# Patient Record
Sex: Male | Born: 1960 | Race: White | Hispanic: No | Marital: Married | Smoking: Never smoker
Health system: Southern US, Community
[De-identification: ages and names within clinical notes are randomized; demographics above are authoritative.]

## PROBLEM LIST (undated history)

## (undated) DIAGNOSIS — N486 Induration penis plastica: Secondary | ICD-10-CM

## (undated) DIAGNOSIS — E7800 Pure hypercholesterolemia, unspecified: Secondary | ICD-10-CM

## (undated) DIAGNOSIS — M1990 Unspecified osteoarthritis, unspecified site: Secondary | ICD-10-CM

## (undated) DIAGNOSIS — R7303 Prediabetes: Secondary | ICD-10-CM

## (undated) DIAGNOSIS — H409 Unspecified glaucoma: Secondary | ICD-10-CM

## (undated) DIAGNOSIS — H269 Unspecified cataract: Secondary | ICD-10-CM

## (undated) HISTORY — DX: Unspecified glaucoma: H40.9

## (undated) HISTORY — DX: Unspecified cataract: H26.9

## (undated) HISTORY — PX: JOINT REPLACEMENT: SHX530

## (undated) HISTORY — PX: EYE SURGERY: SHX253

## (undated) HISTORY — PX: FRACTURE SURGERY: SHX138

## (undated) HISTORY — DX: Induration penis plastica: N48.6

---

## 2001-02-10 HISTORY — PX: VASECTOMY: SHX75

## 2010-06-28 LAB — HM COLONOSCOPY

## 2013-02-11 ENCOUNTER — Encounter (HOSPITAL_COMMUNITY): Payer: Self-pay | Admitting: Pharmacy Technician

## 2013-02-11 ENCOUNTER — Encounter (HOSPITAL_COMMUNITY): Payer: Self-pay | Admitting: *Deleted

## 2013-02-11 ENCOUNTER — Other Ambulatory Visit (HOSPITAL_COMMUNITY): Payer: Self-pay | Admitting: Orthopaedic Surgery

## 2013-02-11 NOTE — Progress Notes (Signed)
I instructed patient to stop taking Naproxen, informed him that he can take Tylenol or Tylenol Arthritis.

## 2013-02-13 MED ORDER — CEFAZOLIN SODIUM-DEXTROSE 2-3 GM-% IV SOLR
2.0000 g | INTRAVENOUS | Status: DC
  Filled 2013-02-13: qty 50

## 2013-02-14 ENCOUNTER — Observation Stay (HOSPITAL_COMMUNITY)
Admission: RE | Admit: 2013-02-14 | Discharge: 2013-02-15 | Disposition: A | Discharge status: Home / Self Care | Payer: BC Managed Care – PPO | Source: Ambulatory Visit | Attending: Orthopaedic Surgery | Admitting: Orthopaedic Surgery

## 2013-02-14 ENCOUNTER — Ambulatory Visit (HOSPITAL_COMMUNITY): Payer: BC Managed Care – PPO

## 2013-02-14 ENCOUNTER — Encounter (HOSPITAL_COMMUNITY): Payer: BC Managed Care – PPO | Admitting: Anesthesiology

## 2013-02-14 ENCOUNTER — Encounter (HOSPITAL_COMMUNITY): Payer: Self-pay | Admitting: *Deleted

## 2013-02-14 ENCOUNTER — Encounter (HOSPITAL_COMMUNITY)
Admission: RE | Disposition: A | Discharge status: Home / Self Care | Payer: Self-pay | Source: Ambulatory Visit | Attending: Orthopaedic Surgery

## 2013-02-14 ENCOUNTER — Ambulatory Visit (HOSPITAL_COMMUNITY): Payer: BC Managed Care – PPO | Admitting: Anesthesiology

## 2013-02-14 DIAGNOSIS — S82892A Other fracture of left lower leg, initial encounter for closed fracture: Secondary | ICD-10-CM

## 2013-02-14 DIAGNOSIS — S8263XA Displaced fracture of lateral malleolus of unspecified fibula, initial encounter for closed fracture: Principal | ICD-10-CM | POA: Insufficient documentation

## 2013-02-14 DIAGNOSIS — Z7982 Long term (current) use of aspirin: Secondary | ICD-10-CM | POA: Insufficient documentation

## 2013-02-14 DIAGNOSIS — X58XXXA Exposure to other specified factors, initial encounter: Secondary | ICD-10-CM | POA: Insufficient documentation

## 2013-02-14 DIAGNOSIS — E7800 Pure hypercholesterolemia, unspecified: Secondary | ICD-10-CM | POA: Insufficient documentation

## 2013-02-14 HISTORY — DX: Pure hypercholesterolemia, unspecified: E78.00

## 2013-02-14 HISTORY — PX: ORIF ANKLE FRACTURE: SHX5408

## 2013-02-14 HISTORY — PX: ORIF ANKLE FRACTURE: SUR919

## 2013-02-14 LAB — CBC
HCT: 45.2 % (ref 39.0–52.0)
Hemoglobin: 14.8 g/dL (ref 13.0–17.0)
MCH: 25.6 pg — ABNORMAL LOW (ref 26.0–34.0)
MCHC: 32.7 g/dL (ref 30.0–36.0)
MCV: 78.2 fL (ref 78.0–100.0)
Platelets: 177 10*3/uL (ref 150–400)
RBC: 5.78 MIL/uL (ref 4.22–5.81)
RDW: 13.1 % (ref 11.5–15.5)
WBC: 7.4 10*3/uL (ref 4.0–10.5)

## 2013-02-14 SURGERY — OPEN REDUCTION INTERNAL FIXATION (ORIF) ANKLE FRACTURE
Anesthesia: Regional | Site: Ankle | Laterality: Left

## 2013-02-14 MED ORDER — SENNA 8.6 MG PO TABS
1.0000 | ORAL_TABLET | Freq: Two times a day (BID) | ORAL | Status: DC
Start: 2013-02-14 — End: 2013-02-15
  Administered 2013-02-14 – 2013-02-15 (×2): 8.6 mg via ORAL
  Filled 2013-02-14 (×3): qty 1

## 2013-02-14 MED ORDER — FINASTERIDE 1 MG PO TABS
1.0000 mg | ORAL_TABLET | Freq: Every morning | ORAL | Status: DC
  Filled 2013-02-14: qty 1

## 2013-02-14 MED ORDER — HYDROMORPHONE HCL PF 1 MG/ML IJ SOLN
0.2500 mg | INTRAMUSCULAR | Status: DC | PRN
Start: 2013-02-14 — End: 2013-02-14

## 2013-02-14 MED ORDER — MORPHINE SULFATE 2 MG/ML IJ SOLN: 1.0000 mg | INTRAMUSCULAR | Status: DC | PRN

## 2013-02-14 MED ORDER — LIDOCAINE HCL (CARDIAC) 20 MG/ML IV SOLN
INTRAVENOUS | Status: DC | PRN
  Administered 2013-02-14: 100 mg via INTRAVENOUS

## 2013-02-14 MED ORDER — CEFAZOLIN SODIUM-DEXTROSE 2-3 GM-% IV SOLR
2.0000 g | Freq: Four times a day (QID) | INTRAVENOUS | Status: AC
  Administered 2013-02-14 – 2013-02-15 (×3): 2 g via INTRAVENOUS
  Filled 2013-02-14 (×5): qty 50

## 2013-02-14 MED ORDER — METOCLOPRAMIDE HCL 5 MG PO TABS: 5.0000 mg | ORAL_TABLET | Freq: Three times a day (TID) | ORAL | Status: DC | PRN

## 2013-02-14 MED ORDER — FENTANYL CITRATE 0.05 MG/ML IJ SOLN
50.0000 ug | Freq: Once | INTRAMUSCULAR | Status: AC
  Administered 2013-02-14: 50 ug via INTRAVENOUS

## 2013-02-14 MED ORDER — DORZOLAMIDE HCL-TIMOLOL MAL PF 22.3-6.8 MG/ML OP SOLN: 1.0000 [drp] | Freq: Two times a day (BID) | OPHTHALMIC | Status: DC

## 2013-02-14 MED ORDER — ONDANSETRON HCL 4 MG PO TABS: 4.0000 mg | ORAL_TABLET | Freq: Four times a day (QID) | ORAL | Status: DC | PRN

## 2013-02-14 MED ORDER — PROMETHAZINE HCL 25 MG/ML IJ SOLN: 12.5000 mg | Freq: Once | INTRAMUSCULAR | Status: DC | PRN

## 2013-02-14 MED ORDER — LATANOPROST 0.005 % OP SOLN
1.0000 [drp] | Freq: Every day | OPHTHALMIC | Status: DC
  Filled 2013-02-14: qty 2.5

## 2013-02-14 MED ORDER — ONDANSETRON HCL 4 MG/2ML IJ SOLN: 4.0000 mg | Freq: Four times a day (QID) | INTRAMUSCULAR | Status: DC | PRN

## 2013-02-14 MED ORDER — ROPIVACAINE HCL 5 MG/ML IJ SOLN
INTRAMUSCULAR | Status: DC | PRN
  Administered 2013-02-14: 25 mL via PERINEURAL

## 2013-02-14 MED ORDER — MIDAZOLAM HCL 10 MG/2ML IJ SOLN
2.0000 mg | Freq: Once | INTRAMUSCULAR | Status: AC
  Administered 2013-02-14: 2 mg via INTRAVENOUS

## 2013-02-14 MED ORDER — ASPIRIN EC 325 MG PO TBEC: 325.0000 mg | DELAYED_RELEASE_TABLET | Freq: Two times a day (BID) | ORAL | Status: DC

## 2013-02-14 MED ORDER — OXYCODONE HCL 5 MG/5ML PO SOLN: 5.0000 mg | Freq: Once | ORAL | Status: DC | PRN

## 2013-02-14 MED ORDER — MIDAZOLAM HCL 2 MG/2ML IJ SOLN
INTRAMUSCULAR | Status: AC
  Administered 2013-02-14: 2 mg
  Filled 2013-02-14: qty 2

## 2013-02-14 MED ORDER — OXYCODONE HCL 5 MG PO TABS: 5.0000 mg | ORAL_TABLET | ORAL | Status: DC | PRN

## 2013-02-14 MED ORDER — LACTATED RINGERS IV SOLN
INTRAVENOUS | Status: DC | PRN
  Administered 2013-02-14: 12:00:00 via INTRAVENOUS

## 2013-02-14 MED ORDER — DORZOLAMIDE HCL-TIMOLOL MAL 2-0.5 % OP SOLN
1.0000 [drp] | Freq: Two times a day (BID) | OPHTHALMIC | Status: DC
  Filled 2013-02-14: qty 10

## 2013-02-14 MED ORDER — FENTANYL CITRATE 0.05 MG/ML IJ SOLN
INTRAMUSCULAR | Status: AC
  Administered 2013-02-14: 50 ug via INTRAVENOUS
  Filled 2013-02-14: qty 2

## 2013-02-14 MED ORDER — ONDANSETRON HCL 4 MG/2ML IJ SOLN
INTRAMUSCULAR | Status: DC | PRN
  Administered 2013-02-14: 4 mg via INTRAVENOUS

## 2013-02-14 MED ORDER — OXYCODONE HCL 5 MG PO TABS: 5.0000 mg | ORAL_TABLET | Freq: Once | ORAL | Status: DC | PRN

## 2013-02-14 MED ORDER — DIPHENHYDRAMINE HCL 12.5 MG/5ML PO ELIX: 25.0000 mg | ORAL_SOLUTION | ORAL | Status: DC | PRN

## 2013-02-14 MED ORDER — POLYETHYLENE GLYCOL 3350 17 G PO PACK: 17.0000 g | PACK | Freq: Every day | ORAL | Status: DC | PRN

## 2013-02-14 MED ORDER — CHLORHEXIDINE GLUCONATE 4 % EX LIQD: 60.0000 mL | Freq: Once | CUTANEOUS | Status: DC

## 2013-02-14 MED ORDER — METHOCARBAMOL 500 MG PO TABS
500.0000 mg | ORAL_TABLET | Freq: Four times a day (QID) | ORAL | Status: DC | PRN
  Filled 2013-02-14: qty 1

## 2013-02-14 MED ORDER — ACETAMINOPHEN 325 MG PO TABS: 325.0000 mg | ORAL_TABLET | ORAL | Status: DC | PRN

## 2013-02-14 MED ORDER — METHOCARBAMOL 100 MG/ML IJ SOLN
500.0000 mg | Freq: Four times a day (QID) | INTRAVENOUS | Status: DC | PRN
  Filled 2013-02-14: qty 5

## 2013-02-14 MED ORDER — HYDROCODONE-ACETAMINOPHEN 5-325 MG PO TABS: 1.0000 | ORAL_TABLET | Freq: Four times a day (QID) | ORAL | Status: DC | PRN

## 2013-02-14 MED ORDER — SORBITOL 70 % SOLN
30.0000 mL | Freq: Every day | Status: DC | PRN
  Filled 2013-02-14: qty 30

## 2013-02-14 MED ORDER — SODIUM CHLORIDE 0.9 % IV SOLN
INTRAVENOUS | Status: DC
  Administered 2013-02-14 – 2013-02-15 (×2): via INTRAVENOUS

## 2013-02-14 MED ORDER — HYDROCODONE-ACETAMINOPHEN 5-325 MG PO TABS
1.0000 | ORAL_TABLET | ORAL | Status: DC | PRN
  Administered 2013-02-15 (×3): 2 via ORAL
  Filled 2013-02-14 (×3): qty 2

## 2013-02-14 MED ORDER — METOCLOPRAMIDE HCL 5 MG/ML IJ SOLN: 5.0000 mg | Freq: Three times a day (TID) | INTRAMUSCULAR | Status: DC | PRN

## 2013-02-14 MED ORDER — ACETAMINOPHEN 160 MG/5ML PO SOLN
325.0000 mg | ORAL | Status: DC | PRN
  Filled 2013-02-14: qty 20.3

## 2013-02-14 MED ORDER — ASPIRIN EC 325 MG PO TBEC
325.0000 mg | DELAYED_RELEASE_TABLET | Freq: Two times a day (BID) | ORAL | Status: DC
Start: 2013-02-14 — End: 2013-02-15
  Filled 2013-02-14 (×2): qty 1

## 2013-02-14 MED ORDER — ARTIFICIAL TEARS OP OINT
TOPICAL_OINTMENT | OPHTHALMIC | Status: DC | PRN
  Administered 2013-02-14: 1 via OPHTHALMIC

## 2013-02-14 MED ORDER — CEFAZOLIN SODIUM-DEXTROSE 2-3 GM-% IV SOLR
INTRAVENOUS | Status: DC | PRN
  Administered 2013-02-14: 2 g via INTRAVENOUS

## 2013-02-14 MED ORDER — PROPOFOL 10 MG/ML IV BOLUS
INTRAVENOUS | Status: DC | PRN
  Administered 2013-02-14: 200 mg via INTRAVENOUS

## 2013-02-14 MED ORDER — MAGNESIUM CITRATE PO SOLN
1.0000 | Freq: Once | ORAL | Status: AC | PRN
  Filled 2013-02-14: qty 296

## 2013-02-14 SURGICAL SUPPLY — 61 items
BANDAGE ELASTIC 4 VELCRO ST LF (GAUZE/BANDAGES/DRESSINGS) ×2 IMPLANT
BANDAGE ELASTIC 6 VELCRO ST LF (GAUZE/BANDAGES/DRESSINGS) ×2 IMPLANT
BIT DRILL 3.5 QC 155 (BIT) ×2 IMPLANT
BIT DRILL QC 2.7 6.3IN  SHORT (BIT) ×1
BIT DRILL QC 2.7 6.3IN SHORT (BIT) ×1 IMPLANT
BNDG COHESIVE 4X5 TAN STRL (GAUZE/BANDAGES/DRESSINGS) ×2 IMPLANT
BNDG COHESIVE 6X5 TAN STRL LF (GAUZE/BANDAGES/DRESSINGS) ×2 IMPLANT
CLOTH BEACON ORANGE TIMEOUT ST (SAFETY) ×2 IMPLANT
COVER SURGICAL LIGHT HANDLE (MISCELLANEOUS) ×2 IMPLANT
CUFF TOURNIQUET SINGLE 34IN LL (TOURNIQUET CUFF) ×2 IMPLANT
CUFF TOURNIQUET SINGLE 44IN (TOURNIQUET CUFF) IMPLANT
DRAPE C-ARM 42X72 X-RAY (DRAPES) ×2 IMPLANT
DRAPE C-ARMOR (DRAPES) ×2 IMPLANT
DRAPE INCISE IOBAN 66X45 STRL (DRAPES) ×2 IMPLANT
DRAPE U-SHAPE 47X51 STRL (DRAPES) ×4 IMPLANT
DRSG PAD ABDOMINAL 8X10 ST (GAUZE/BANDAGES/DRESSINGS) ×2 IMPLANT
DURAPREP 26ML APPLICATOR (WOUND CARE) ×2 IMPLANT
ELECT CAUTERY BLADE 6.4 (BLADE) ×2 IMPLANT
ELECT REM PT RETURN 9FT ADLT (ELECTROSURGICAL) ×2
ELECTRODE REM PT RTRN 9FT ADLT (ELECTROSURGICAL) ×1 IMPLANT
FACESHIELD LNG OPTICON STERILE (SAFETY) IMPLANT
GAUZE XEROFORM 5X9 LF (GAUZE/BANDAGES/DRESSINGS) ×2 IMPLANT
GLOVE SURG SS PI 7.5 STRL IVOR (GLOVE) ×4 IMPLANT
GOWN STRL NON-REIN LRG LVL3 (GOWN DISPOSABLE) ×4 IMPLANT
GOWN STRL REIN XL XLG (GOWN DISPOSABLE) ×2 IMPLANT
KIT BASIN OR (CUSTOM PROCEDURE TRAY) ×2 IMPLANT
KIT ROOM TURNOVER OR (KITS) ×2 IMPLANT
NEEDLE HYPO 25GX1X1/2 BEV (NEEDLE) ×2 IMPLANT
NS IRRIG 1000ML POUR BTL (IV SOLUTION) ×2 IMPLANT
PACK ORTHO EXTREMITY (CUSTOM PROCEDURE TRAY) ×2 IMPLANT
PAD ARMBOARD 7.5X6 YLW CONV (MISCELLANEOUS) ×4 IMPLANT
PAD CAST 3X4 CTTN HI CHSV (CAST SUPPLIES) ×2 IMPLANT
PADDING CAST COTTON 3X4 STRL (CAST SUPPLIES) ×2
PADDING CAST COTTON 6X4 STRL (CAST SUPPLIES) ×2 IMPLANT
PADDING CAST SYN 6 (CAST SUPPLIES) ×1
PADDING CAST SYNTHETIC 4 (CAST SUPPLIES) ×1
PADDING CAST SYNTHETIC 4X4 STR (CAST SUPPLIES) ×1 IMPLANT
PADDING CAST SYNTHETIC 6X4 NS (CAST SUPPLIES) ×1 IMPLANT
PLATE TUBULAR 1/3 6 HOLE (Plate) ×2 IMPLANT
SCREW 5.0X18 (Screw) ×4 IMPLANT
SCREW CANCELLOUS 5.0X16MM (Screw) ×2 IMPLANT
SCREW NL 3.5X14 (Screw) ×2 IMPLANT
SCREW NLCK 16X3.5XST CORT PRLC (Screw) ×1 IMPLANT
SCREW NON LOCK 3.5X12 (Screw) ×2 IMPLANT
SCREW NONLOCK 3.5X16 (Screw) ×1 IMPLANT
SCREW NONLOCK 3.5X18 (Screw) ×2 IMPLANT
SPONGE GAUZE 4X4 12PLY (GAUZE/BANDAGES/DRESSINGS) ×2 IMPLANT
SPONGE GAUZE 4X4 12PLY STER LF (GAUZE/BANDAGES/DRESSINGS) ×2 IMPLANT
SPONGE LAP 18X18 X RAY DECT (DISPOSABLE) ×4 IMPLANT
SUCTION FRAZIER TIP 10 FR DISP (SUCTIONS) ×2 IMPLANT
SUT ETHILON 2 0 FS 18 (SUTURE) IMPLANT
SUT ETHILON 3 0 PS 1 (SUTURE) ×2 IMPLANT
SUT VIC AB 0 CT1 27 (SUTURE) ×1
SUT VIC AB 0 CT1 27XBRD ANBCTR (SUTURE) ×1 IMPLANT
SUT VIC AB 2-0 CT1 27 (SUTURE) ×1
SUT VIC AB 2-0 CT1 TAPERPNT 27 (SUTURE) ×1 IMPLANT
SYR CONTROL 10ML LL (SYRINGE) ×2 IMPLANT
TOWEL OR 17X24 6PK STRL BLUE (TOWEL DISPOSABLE) ×2 IMPLANT
TOWEL OR 17X26 10 PK STRL BLUE (TOWEL DISPOSABLE) ×4 IMPLANT
TUBE CONNECTING 12X1/4 (SUCTIONS) ×2 IMPLANT
WATER STERILE IRR 1000ML POUR (IV SOLUTION) ×2 IMPLANT

## 2013-02-14 NOTE — H&P (Signed)
PREOPERATIVE H&P  Chief Complaint: LEFT ANKLE FRACTURE  HPI: Devon Fowler is a 53 y.o. male who presents for surgical treatment of LEFT ANKLE FRACTURE.  He denies any changes in medical history.  Past Medical History  Diagnosis Date  . High cholesterol    Past Surgical History  Procedure Laterality Date  . Vasectomy  2003   History   Social History  . Marital Status: Married    Spouse Name: N/A    Number of Children: N/A  . Years of Education: N/A   Social History Main Topics  . Smoking status: Never Smoker   . Smokeless tobacco: None  . Alcohol Use: No  . Drug Use: No  . Sexual Activity: None   Other Topics Concern  . None   Social History Narrative  . None   History reviewed. No pertinent family history. Allergies  Allergen Reactions  . Lamisil [Terbinafine Hcl] Hives   Prior to Admission medications   Medication Sig Start Date End Date Taking? Authorizing Provider  aspirin EC 81 MG tablet Take 81 mg by mouth daily.   Yes Historical Provider, MD  Cholecalciferol (VITAMIN D) 2000 UNITS CAPS Take 2,000 Units by mouth 2 (two) times daily.   Yes Historical Provider, MD  Dorzolamide HCl-Timolol Mal PF 22.3-6.8 MG/ML SOLN Apply 1 drop to eye 2 (two) times daily.   Yes Historical Provider, MD  finasteride (PROPECIA) 1 MG tablet Take 1 mg by mouth every morning.   Yes Historical Provider, MD  latanoprost (XALATAN) 0.005 % ophthalmic solution Place 1 drop into both eyes at bedtime.   Yes Historical Provider, MD  lovastatin (MEVACOR) 40 MG tablet Take 40 mg by mouth at bedtime.   Yes Historical Provider, MD  NAPROXEN PO Take 200 mg by mouth daily as needed (for pain).   Yes Historical Provider, MD     Positive ROS: All other systems have been reviewed and were otherwise negative with the exception of those mentioned in the HPI and as above.  Physical Exam: General: Alert, no acute distress Cardiovascular: No pedal edema Respiratory: No cyanosis, no use of accessory  musculature GI: No organomegaly, abdomen is soft and non-tender Skin: No lesions in the area of chief complaint Neurologic: Sensation intact distally Psychiatric: Patient is competent for consent with normal mood and affect Lymphatic: No axillary or cervical lymphadenopathy  MUSCULOSKELETAL:  LLE: - moderate swelling of the ankle - NVI - skin intact  Assessment: LEFT ANKLE FRACTURE  Plan: Plan for Procedure(s): OPEN REDUCTION INTERNAL FIXATION (ORIF) ANKLE FRACTURE  The risks benefits and alternatives were discussed with the patient including but not limited to the risks of nonoperative treatment, versus surgical intervention including infection, bleeding, nerve injury,  blood clots, cardiopulmonary complications, morbidity, mortality, among others, and they were willing to proceed.   Cheral AlmasXu, Naiping Michael, MD   02/14/2013 12:07 PM

## 2013-02-14 NOTE — Transfer of Care (Signed)
Immediate Anesthesia Transfer of Care Note  Patient: Devon Fowler  Procedure(s) Performed: Procedure(s): OPEN REDUCTION INTERNAL FIXATION (ORIF) ANKLE FRACTURE (Left)  Patient Location: PACU  Anesthesia Type:General and Regional  Level of Consciousness: awake, alert , oriented and sedated  Airway & Oxygen Therapy: Patient Spontanous Breathing and Patient connected to nasal cannula oxygen  Post-op Assessment: Report given to PACU RN, Post -op Vital signs reviewed and stable and Patient moving all extremities  Post vital signs: Reviewed and stable  Complications: No apparent anesthesia complications

## 2013-02-14 NOTE — Preoperative (Signed)
Beta Blockers   Reason not to administer Beta Blockers:Not Applicable 

## 2013-02-14 NOTE — Anesthesia Postprocedure Evaluation (Signed)
  Anesthesia Post-op Note  Patient: Devon Fowler  Procedure(s) Performed: Procedure(s): OPEN REDUCTION INTERNAL FIXATION (ORIF) ANKLE FRACTURE (Left)  Patient Location: PACU  Anesthesia Type:General and Regional  Level of Consciousness: awake and patient cooperative  Airway and Oxygen Therapy: Patient Spontanous Breathing  Post-op Pain: none  Post-op Assessment: Post-op Vital signs reviewed, Patient's Cardiovascular Status Stable, Respiratory Function Stable, Patent Airway and No signs of Nausea or vomiting  Post-op Vital Signs: Reviewed and stable  Complications: No apparent anesthesia complications

## 2013-02-14 NOTE — Discharge Instructions (Signed)
Cast or Splint Care Casts and splints support injured limbs and keep bones from moving while they heal. It is important to care for your cast or splint at home.  HOME CARE INSTRUCTIONS  Keep the cast or splint uncovered during the drying period. It can take 24 to 48 hours to dry if it is made of plaster. A fiberglass cast will dry in less than 1 hour.  Do not rest the cast on anything harder than a pillow for the first 24 hours.  Do not put weight on your injured limb or apply pressure to the cast until your caregiver gives you permission.  Keep the cast or splint dry. Wet casts or splints can lose their shape and may not support the limb as well. Also, wet skin can become infected. Cover the cast or splint with a plastic bag when bathing or when out in the rain or snow. If the cast is on the trunk of the body, take sponge baths until the cast is removed.  Keep your cast or splint clean. Soiled casts may be wiped with a moistened cloth.  Do not place any foreign objects under your cast or splint. Do not try to scratch the skin under the cast with any object. The object could get stuck inside the cast. Also, scratching could lead to an infection.  Do not remove padding from inside your cast.  Exercise all joints next to the injury that are not immobilized by the cast or splint. For example, if you have a long leg cast, exercise the hip joint and toes. If you have an arm cast or splint, exercise the shoulder, elbow, thumb, and fingers.  Elevate your injured arm or leg on 1 or 2 pillows for the first 1 to 3 days to decrease swelling and pain.It is best if you can comfortably elevate your cast so it is higher than your heart. SEEK MEDICAL CARE IF:   Your cast or splint cracks.  Your cast or splint is too tight or too loose.  You experience unbearable itching inside the cast.  Your cast becomes wet or develops a soft spot or area.  You have a bad smell coming from inside your cast.  You  get an object stuck under your cast.  Your skin around the cast becomes red or raw.  You develop a new pain or worsening pain after the cast has been applied. SEEK IMMEDIATE MEDICAL CARE IF:   You have fluid leaking through the cast.  You are unable to move your fingers or toes.  You have discolored, cool, painful, or very swollen fingers or toes beyond the cast.  You have tingling or numbness around the injured area.  You have severe pain or pressure under the cast.  You develop any difficulty with your breathing or have shortness of breath.  You develop chest pain. Document Released: 01/25/2000 Document Revised: 04/21/2011 Document Reviewed: 08/05/2012 [facility] Patient Information 2014 [employer], [place].   1. Strict non weight bearing  2. Elevate with toes above nose 3. Take medicines as prescribed 4. May return to work next week 5. Ice extremity 20 minutes on and 20 minutes off around the clock

## 2013-02-14 NOTE — Anesthesia Procedure Notes (Signed)
Anesthesia Regional Block:  Popliteal block  Pre-Anesthetic Checklist: ,, timeout performed, Correct Patient, Correct Site, Correct Laterality, Correct Procedure, Correct Position, site marked, Risks and benefits discussed,  Surgical consent,  Pre-op evaluation,  At surgeon's request and post-op pain management  Laterality: Left  Prep: chloraprep       Needles:  Injection technique: Single-shot  Needle Type: Stimulator Needle - 80          Additional Needles:  Procedures: ultrasound guided (picture in chart) Popliteal block Narrative:  Start time: 02/14/2013 11:58 AM End time: 02/14/2013 12:06 PM Injection made incrementally with aspirations every 5 mL.  Performed by: Personally  Anesthesiologist: Maple HudsonMoser

## 2013-02-14 NOTE — Anesthesia Preprocedure Evaluation (Signed)
Anesthesia Evaluation  Patient identified by MRN, date of birth, ID band Patient awake    Reviewed: Allergy & Precautions, H&P , NPO status , Patient's Chart, lab work & pertinent test results  History of Anesthesia Complications Negative for: history of anesthetic complications  Airway Mallampati: I TM Distance: >3 FB Neck ROM: Full    Dental  (+) Teeth Intact   Pulmonary neg pulmonary ROS,    Pulmonary exam normal       Cardiovascular Exercise Tolerance: Good negative cardio ROS  Rhythm:Regular Rate:Normal - Systolic murmurs and - Diastolic murmurs    Neuro/Psych negative neurological ROS  negative psych ROS   GI/Hepatic negative GI ROS, Neg liver ROS,   Endo/Other  negative endocrine ROS  Renal/GU negative Renal ROS  negative genitourinary   Musculoskeletal   Abdominal Normal abdominal exam  (+)   Peds  Hematology negative hematology ROS (+)   Anesthesia Other Findings   Reproductive/Obstetrics                           Anesthesia Physical Anesthesia Plan  ASA: II  Anesthesia Plan: General and Regional   Post-op Pain Management:    Induction:   Airway Management Planned: LMA  Additional Equipment:   Intra-op Plan:   Post-operative Plan: Extubation in OR  Informed Consent: I have reviewed the patients History and Physical, chart, labs and discussed the procedure including the risks, benefits and alternatives for the proposed anesthesia with the patient or authorized representative who has indicated his/her understanding and acceptance.   Dental advisory given  Plan Discussed with: CRNA and Surgeon  Anesthesia Plan Comments:         Anesthesia Quick Evaluation

## 2013-02-14 NOTE — Op Note (Signed)
   Date of Surgery: 02/14/2013  INDICATIONS: [name] is a 53 y.o.-year-old male who sustained a left ankle fracture; he was indicated for open reduction and internal fixation due to the displaced nature of the articular fracture and came to the operating room today for this procedure. The patient did consent to the procedure after discussion of the risks and benefits.  PREOPERATIVE DIAGNOSIS: left lateral malleolar ankle fracture  POSTOPERATIVE DIAGNOSIS: Same.  PROCEDURE: Open treatment of left lateral malleolar ankle fracture with internal fixation  SURGEON: N. Glee ArvinMichael Ziaire Hagos, M.D.  ASSIST: none.  ANESTHESIA:  general, regional  IV FLUIDS AND URINE: See anesthesia.  ESTIMATED BLOOD LOSS: minimal mL.  IMPLANTS: [employer] 5 hole VLP semitubular plate  COMPLICATIONS: None.  DESCRIPTION OF PROCEDURE: The patient was brought to the operating room and placed supine on the operating table.  The patient had been signed prior to the procedure and this was documented. The patient had the anesthesia placed by the anesthesiologist.  A nonsterile tourniquet was placed on the upper thigh.  The prep verification and incision time-outs were performed to confirm that this was the correct patient, site, side and location. The patient had an SCD on the opposite lower extremity. The patient did receive antibiotics prior to the incision and was re-dosed during the procedure as needed at indicated intervals.  The patient had the lower extremity prepped and draped in the standard surgical fashion.  The extremity was exsanguinated using an esmarch bandage and the tourniquet was inflated to 350 mm Hg.  We began by using a laterally-based incision over the lateral aspect of the fibula. Full-thickness flaps were created. The superficial peroneal nerve was not encountered. The periosteum was sharply elevated off of the side of the fibula. Any entrapped periosteum was cleared out of the fracture site. Reduction  was achieved using manual maneuvers and a tenaculum clamp. I then placed a 3.5 mm lag screw from posterior to anterior by technique. I then released the tenaculum clamp and the fracture maintained reduction. The reduction was confirmed on orthogonal x-rays. I then sized a 5 hole VLP semitubular plate. I placed 2 5.0 mm cancellus screws distal to the fracture and 3 3.5 mm nonlocking screws proximal to the fracture. Once this was done I confirm adequate placement of the hardware on orthogonal views. I then performed a stress maneuver to assess the medial ankle ligaments. The stress maneuver did not widen the medial clear space. The wound was then thoroughly irrigated using sterile saline. The wound was then closed in a layered fashion using 0 Vicryl for the fascia 2-0 Vicryl for the subcutaneous tissue and 3-0 nylon for the skin. A sterile dressing was applied. He was placed in a short-leg splint with the ankle at 90. He woke from anesthesia uneventfully and was transferred to the PACU in stable condition.  POSTOPERATIVE PLAN: [name] will remain nonweightbearing on this leg for approximately 6 weeks; he will return for suture removal in 2 weeks.  He will be immobilized in a short leg splint and then transitioned to a short leg cast at his first follow up appointment.  [name] will receive DVT prophylaxis based on other medications, activity level, and risk ratio of bleeding to thrombosis.  Mayra ReelN. Michael Shai Rasmussen, MD [facility] [phone] 2:44 PM

## 2013-02-15 ENCOUNTER — Encounter (HOSPITAL_COMMUNITY): Payer: Self-pay | Admitting: General Practice

## 2013-02-15 NOTE — Care Management Note (Signed)
  Page 1 of 1   02/15/2013     10:17:02 AM   CARE MANAGEMENT NOTE 02/15/2013  Patient:  Devon Fowler,Devon Fowler   Account Number:  [id number]  Date Initiated:  02/15/2013  Documentation initiated by:  Ronny FlurryWILE,Julie Paolini  Subjective/Objective Assessment:     Action/Plan:   Anticipated DC Date:  02/15/2013   Anticipated DC Plan:           Choice offered to / List presented to:             Status of service:   Medicare Important Message given?   (If response is "NO", the following Medicare IM given date fields will be blank) Date Medicare IM given:   Date Additional Medicare IM given:    Discharge Disposition:    Per UR Regulation:    If discussed at Long Length of Stay Meetings, dates discussed:    Comments:  02-15-13 Patient needing knee scooter . Jill AlexandersJustin from [facility] instructed case manager to give patient a copy of order and send patient or family to retail store [address] phone [phone].   Patient stated he has crutches and his wife could take him . Patient does not want rolling walker , 3 in 1 or bedside commode .   No PT or OT notes entered at present.   Ronny FlurryHeather Khushboo Chuck RN BSN [phone]

## 2013-02-15 NOTE — Discharge Summary (Signed)
Physician Discharge Summary  Patient ID: Devon Fowler MRN: [id number] DOB/AGE: 55/20/1962 55 y.o.  Admit date: 20-Feb-2013 Discharge date: 02/15/2013  Admission Diagnoses:  Ankle fracture, left  Discharge Diagnoses:  Principal Problem:   Ankle fracture, left Active Problems:   Closed left ankle fracture   Past Medical History  Diagnosis Date  . High cholesterol     Surgeries: Procedure(s): OPEN REDUCTION INTERNAL FIXATION (ORIF) ANKLE FRACTURE on Feb 20, 2013   Consultants (if any):    Discharged Condition: Improved  Hospital Course: Devon Fowler is an 53 y.o. male who was admitted 2013/02/20 with a diagnosis of Ankle fracture, left and went to the operating room on 20-Feb-2013 and underwent the above named procedures.    He was given perioperative antibiotics:      Anti-infectives   Start     Dose/Rate Route Frequency Ordered Stop   2013/02/20 2030  ceFAZolin (ANCEF) IVPB 2 g/50 mL premix     2 g 100 mL/hr over 30 Minutes Intravenous Every 6 hours 20-Feb-2013 2029 02/15/13 1429   02-20-2013 0600  ceFAZolin (ANCEF) IVPB 2 g/50 mL premix  Status:  Discontinued     2 g 100 mL/hr over 30 Minutes Intravenous On call to O.R. 02/13/13 1609 02/20/13 2017    .  He was given sequential compression devices, early ambulation, and aspirin for DVT prophylaxis.  He benefited maximally from the hospital stay and there were no complications.    Recent vital signs:  Filed Vitals:   02/15/13 0500  BP: 117/75  Pulse: 54  Temp: 97.9 F (36.6 C)  Resp: 16    Recent laboratory studies:  Lab Results  Component Value Date   HGB 14.8 Feb 20, 2013   Lab Results  Component Value Date   WBC 7.4 20-Feb-2013   PLT 177 20-Feb-2013   No results found for this basename: INR   No results found for this basename: NA,  K,  CL,  CO2,  bun,  creatinine,  glucose    Discharge Medications:     Medication List         aspirin EC 325 MG tablet  Take 1 tablet (325 mg total) by mouth 2 (two) times daily.     Dorzolamide HCl-Timolol Mal PF 22.3-6.8 MG/ML Soln  Apply 1 drop to eye 2 (two) times daily.     finasteride 1 MG tablet  Commonly known as:  PROPECIA  Take 1 mg by mouth every morning.     HYDROcodone-acetaminophen 5-325 MG per tablet  Commonly known as:  NORCO  Take 1-2 tablets by mouth every 6 (six) hours as needed.     latanoprost 0.005 % ophthalmic solution  Commonly known as:  XALATAN  Place 1 drop into both eyes at bedtime.     lovastatin 40 MG tablet  Commonly known as:  MEVACOR  Take 40 mg by mouth at bedtime.     NAPROXEN PO  Take 200 mg by mouth daily as needed (for pain).     Vitamin D 2000 UNITS Caps  Take 2,000 Units by mouth 2 (two) times daily.        Diagnostic Studies: Dg Ankle Complete Left  02/20/13   CLINICAL DATA:  Fracture fixation.  EXAM: LEFT ANKLE COMPLETE - 3+ VIEW; DG C-ARM 1-60 MIN  COMPARISON:  None.  FINDINGS: We are provided with 3 fluoroscopic intraoperative spot views of the left ankle. Images demonstrate plate and screws and interfragmentary screw fixation of a distal fibular fracture. Position and alignment appear  near anatomic. No acute abnormality is identified.  IMPRESSION: ORIF left fibular fracture.   Electronically Signed   By: Drusilla Kannerhomas  Dalessio M.D.   On: 02/14/2013 15:39   Dg C-arm 1-60 Min  02/14/2013   CLINICAL DATA:  Fracture fixation.  EXAM: LEFT ANKLE COMPLETE - 3+ VIEW; DG C-ARM 1-60 MIN  COMPARISON:  None.  FINDINGS: We are provided with 3 fluoroscopic intraoperative spot views of the left ankle. Images demonstrate plate and screws and interfragmentary screw fixation of a distal fibular fracture. Position and alignment appear near anatomic. No acute abnormality is identified.  IMPRESSION: ORIF left fibular fracture.   Electronically Signed   By: Drusilla Kannerhomas  Dalessio M.D.   On: 02/14/2013 15:39    Disposition: Final discharge disposition not confirmed  Discharge Orders   Future Orders Complete By Expires   Call MD / Call 911  As  directed    Comments:     If you experience chest pain or shortness of breath, CALL 911 and be transported to the hospital emergency room.  If you develope a fever above 101.5 F, pus (white drainage) or increased drainage or redness at the wound, or calf pain, call your surgeon's office.   Constipation Prevention  As directed    Comments:     Drink plenty of fluids.  Prune juice may be helpful.  You may use a stool softener, such as Colace (over the counter) 100 mg twice a day.  Use MiraLax (over the counter) for constipation as needed.   Diet - low sodium heart healthy  As directed    Driving restrictions  As directed    Comments:     No driving while taking narcotic pain meds.   Increase activity slowly as tolerated  As directed    Non weight bearing  As directed    Questions:     Laterality:     Extremity:     Non weight bearing  As directed    Questions:     Laterality:  left   Extremity:  Lower      Follow-up Information   Follow up with Cheral AlmasXu, Naiping Michael, MD In 2 weeks.   Specialty:  Orthopedic Surgery   Contact information:   [address] [place] [id number] [phone]        Signed: Cheral AlmasXu, Naiping Michael 02/15/2013, 7:54 AM

## 2013-02-15 NOTE — Evaluation (Signed)
Physical Therapy Evaluation Patient Details Name: Devon Fowler MRN: [id number] DOB: February 23, 1960 Today's Date: 02/15/2013 Time: 0850-0905 PT Time Calculation (min): 15 min  PT Assessment / Plan / Recommendation History of Present Illness  ORIF L ankle  Clinical Impression  Pt is mod I with mobility with crutches, would benefit from knee walker to conserve energy and move around more easily at work.  Will benefit from outpatient PT once he is able to bear weight on L ankle    PT Assessment  All further PT needs can be met in the next venue of care    Follow Up Recommendations  Outpatient PT (when able to bear weight)    Does the patient have the potential to tolerate intense rehabilitation      Barriers to Discharge        Equipment Recommendations  Other (comment) (rolling knee walker)    Recommendations for Other Services     Frequency      Precautions / Restrictions Precautions Required Braces or Orthoses:  (L LE cast) Restrictions Weight Bearing Restrictions: Yes LLE Weight Bearing: Non weight bearing   Pertinent Vitals/Pain Pt c/o "discomfort" when L LE in dependent position, eases with elevation      Mobility  Bed Mobility Bed Mobility: Supine to Sit;Sit to Supine Supine to Sit: 6: Modified independent (Device/Increase time) Sit to Supine: 6: Modified independent (Device/Increase time) Details for Bed Mobility Assistance: increased time Transfers Transfers: Sit to Stand;Stand to Sit Sit to Stand: 6: Modified independent (Device/Increase time) Stand to Sit: 6: Modified independent (Device/Increase time) Details for Transfer Assistance: with crutches Ambulation/Gait Ambulation/Gait Assistance: 6: Modified independent (Device/Increase time) Ambulation Distance (Feet): 200 Feet Assistive device: Crutches Ambulation/Gait Assistance Details: pt had been using crutches prior to admission, would like a knee scooter for home and work Stairs: Yes Stairs Assistance: 5:  Supervision Stair Management Technique: With crutches Number of Stairs: 3    Exercises     PT Diagnosis: Difficulty walking  PT Problem List: Decreased strength;Decreased mobility PT Treatment Interventions:       PT Goals(Current goals can be found in the care plan section) Acute Rehab PT Goals PT Goal Formulation: No goals set, d/c therapy  Visit Information  Last PT Received On: 02/15/13 Assistance Needed: +1 History of Present Illness: ORIF L ankle       Prior Functioning  Home Living Family/patient expects to be discharged to:: Private residence Living Arrangements: Spouse/significant other Type of Home: House Home Access: Stairs to enter Technical brewer of Steps: 2 Home Layout: Able to live on main level with bedroom/bathroom Home Equipment: Crutches Prior Function Level of Independence: Independent Communication Communication: No difficulties    Cognition  Cognition Arousal/Alertness: Awake/alert Behavior During Therapy: WFL for tasks assessed/performed Overall Cognitive Status: Within Functional Limits for tasks assessed    Extremity/Trunk Assessment Upper Extremity Assessment Upper Extremity Assessment: Overall WFL for tasks assessed Lower Extremity Assessment Lower Extremity Assessment: Overall WFL for tasks assessed Cervical / Trunk Assessment Cervical / Trunk Assessment: Normal   Balance    End of Session PT - End of Session Equipment Utilized During Treatment: Gait belt Activity Tolerance: Patient tolerated treatment well Patient left: in bed;with call bell/phone within reach Nurse Communication: Mobility status  GP Functional Assessment Tool Used: clinical judgement Functional Limitation: Mobility: Walking and moving around Mobility: Walking and Moving Around Current Status (B4037): At least 1 percent but less than 20 percent impaired, limited or restricted Mobility: Walking and Moving Around Goal Status [phone]): At  least 1 percent but  less than 20 percent impaired, limited or restricted Mobility: Walking and Moving Around Discharge Status [phone]): At least 1 percent but less than 20 percent impaired, limited or restricted   [facility] 02/15/2013, 10:44 AM

## 2013-02-15 NOTE — Progress Notes (Signed)
   Subjective:  Patient reports pain as mild.    Objective:   VITALS:   Filed Vitals:   02/14/13 1945 02/14/13 2000 02/14/13 2019 02/15/13 0500  BP: 124/68  104/62 117/75  Pulse: 62 67 66 54  Temp:   97.5 F (36.4 C) 97.9 F (36.6 C)  TempSrc:   Oral Oral  Resp: 12 11 15 16   Height:   6\' 2"  (1.88 m)   Weight:   86.637 kg (191 lb)   SpO2: 96% 98% 99% 94%    Neurologically intact Neurovascular intact Sensation intact distally Intact pulses distally Incision: dressing C/D/I and no drainage No cellulitis present Compartment soft   Lab Results  Component Value Date   WBC 7.4 02/14/2013   HGB 14.8 02/14/2013   HCT 45.2 02/14/2013   MCV 78.2 02/14/2013   PLT 177 02/14/2013     Assessment/Plan: 1 Day Post-Op   Problem List Items Addressed This Visit     Musculoskeletal and Integument   *Ankle fracture, left - Primary   Relevant Orders      Non weight bearing      Call MD / Call 911      Diet - low sodium heart healthy      Constipation Prevention      Increase activity slowly as tolerated      Driving restrictions      Non weight bearing      Advance diet Up with therapy Up with PT/OT DVT ppx - SCDs, ambulation, asa NWB left and lower extremity Pain control Discharge planning - may d/c home when pt clears PT and has DMEs   Cheral AlmasXu, Szymon Foiles Michael 02/15/2013, 7:53 AM [phone]

## 2013-02-15 NOTE — Progress Notes (Signed)
Patient discharged home in stable condition. Verbalizes understanding of all discharge instructions, including home medications and follow up appointments. 

## 2013-02-15 NOTE — Evaluation (Signed)
Occupational Therapy Evaluation Patient Details Name: Adalberto Illed J Nilan MRN: [id number] DOB: [birth year] Today's Date: 02/15/2013 Time: [id number] OT Time Calculation (min): 28 min  OT Assessment / Plan / Recommendation History of present illness 53 yo male s/p ORIF Lt ankle currently NWB   Clinical Impression   Patient evaluated by Occupational Therapy with no further acute OT needs identified. All education has been completed and the patient has no further questions. See below for any follow-up Occupational Therapy or equipment needs. OT to sign off. Thank you for referral.      OT Assessment  Patient does not need any further OT services    Follow Up Recommendations  No OT follow up    Barriers to Discharge      Equipment Recommendations  None recommended by OT    Recommendations for Other Services    Frequency       Precautions / Restrictions Precautions Precautions: Fall Required Braces or Orthoses:  (L LE cast) Restrictions Weight Bearing Restrictions: Yes LLE Weight Bearing: Non weight bearing   Pertinent Vitals/Pain None reported    ADL  Eating/Feeding: Independent Where Assessed - Eating/Feeding: Chair Grooming: Wash/dry hands;Wash/dry face;Modified independent Where Assessed - Grooming: Other (comment) (unsupported on knee walker) Lower Body Dressing: Modified independent Where Assessed - Lower Body Dressing: Other (comment) (EOB sit<>STand) Toilet Transfer: Supervision/safety Toilet Transfer Method: Sit to [place] Toilet Transfer Equipment: Regular height toilet;Grab bars Equipment Used: Gait belt;Other (comment) (knee walker) Transfers/Ambulation Related to ADLs: Pt transfered from EOB to knee walker. Pt transfered from knee walker to chair with wheels. Pt with lOB exiting room with knee walker. Pt educated on turning and backing up with knee walker ADL Comments: Pt educated on using double bags on LT LE to prevent dressing from getting wet with showering. Pt has  grab bar in shower and plans to stand. pt educated on use of seat to allow NWB status to be maintained. Pt expressed being able to complete task in NWB. Pt and wife educated on cutting scrub pants or socks for dressing left LE. Pt advised to take time out of work schedule to elevate LT LE. 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 Pt advised that reducing caseload initially might be advised in case pain from decr elevation occurred. Pt completed toilet transfer with knee walker. Pt plans to use crutches at home and knee walker at work    OT Diagnosis:    OT Problem List:   OT Treatment Interventions:     OT Goals(Current goals can be found in the care plan section)    Visit Information  Last OT Received On: 02/15/13 Assistance Needed: +1 History of Present Illness: 53 yo male s/p ORIF Lt ankle currently NWB       Prior Functioning     Home Living Family/patient expects to be discharged to:: Private residence Living Arrangements: Spouse/significant other Available Help at Discharge: Family Type of Home: House Home Access: Stairs to enter Secretary/administratorntrance Stairs-Number of Steps: 2 Home Layout: Able to live on main level with bedroom/bathroom Home Equipment: Crutches (will have knee walker- it is ordered) Prior Function Level of Independence: Independent Communication Communication: No difficulties Dominant Hand: Right         Vision/Perception Vision - History Baseline Vision: Wears glasses all the time Patient Visual Report: No change from baseline   Cognition  Cognition Arousal/Alertness: Awake/alert Behavior During Therapy: WFL for tasks assessed/performed Overall Cognitive Status: Within Functional Limits for tasks  assessed    Extremity/Trunk Assessment Upper Extremity Assessment Upper Extremity Assessment: Overall WFL for tasks assessed Lower Extremity Assessment Lower Extremity Assessment: Defer to PT evaluation Cervical / Trunk  Assessment Cervical / Trunk Assessment: Normal     Mobility Bed Mobility Bed Mobility: Supine to Sit;Sitting - Scoot to Edge of Bed;Sit to Supine Supine to Sit: 6: Modified independent (Device/Increase time) Sitting - Scoot to Edge of Bed: 6: Modified independent (Device/Increase time) Sit to Supine: 6: Modified independent (Device/Increase time) Details for Bed Mobility Assistance: increased time Transfers Transfers: Sit to Stand;Stand to Sit Sit to Stand: 6: Modified independent (Device/Increase time) Stand to Sit: 6: Modified independent (Device/Increase time) Details for Transfer Assistance: educated on hand placement with knee walker     Exercise     Balance     End of Session OT - End of Session Activity Tolerance: Patient tolerated treatment well Patient left: in bed;with call bell/phone within reach;with family/visitor present Nurse Communication: Mobility status;Precautions  GO Functional Assessment Tool Used: clincial judgement Functional Limitation: Self care Self Care Current Status (Z3664): At least 1 percent but less than 20 percent impaired, limited or restricted Self Care Goal Status (Q0347): At least 1 percent but less than 20 percent impaired, limited or restricted Self Care Discharge Status [phone]): At least 1 percent but less than 20 percent impaired, limited or restricted   Harolyn Rutherford 02/15/2013, 11:59 AM Pager: [phone]

## 2013-02-16 ENCOUNTER — Encounter (HOSPITAL_COMMUNITY): Payer: Self-pay | Admitting: Orthopaedic Surgery

## 2013-04-08 ENCOUNTER — Ambulatory Visit: Payer: BC Managed Care – PPO | Attending: Orthopaedic Surgery

## 2013-04-08 DIAGNOSIS — R609 Edema, unspecified: Secondary | ICD-10-CM | POA: Insufficient documentation

## 2013-04-08 DIAGNOSIS — M25673 Stiffness of unspecified ankle, not elsewhere classified: Secondary | ICD-10-CM | POA: Insufficient documentation

## 2013-04-08 DIAGNOSIS — M25579 Pain in unspecified ankle and joints of unspecified foot: Secondary | ICD-10-CM | POA: Insufficient documentation

## 2013-04-08 DIAGNOSIS — M6281 Muscle weakness (generalized): Secondary | ICD-10-CM | POA: Insufficient documentation

## 2013-04-08 DIAGNOSIS — M25676 Stiffness of unspecified foot, not elsewhere classified: Secondary | ICD-10-CM | POA: Insufficient documentation

## 2013-04-08 DIAGNOSIS — IMO0001 Reserved for inherently not codable concepts without codable children: Secondary | ICD-10-CM | POA: Insufficient documentation

## 2013-04-15 ENCOUNTER — Ambulatory Visit: Payer: BC Managed Care – PPO | Admitting: Physical Therapy

## 2013-04-22 ENCOUNTER — Encounter: Payer: BC Managed Care – PPO | Admitting: Physical Therapy

## 2013-04-29 ENCOUNTER — Encounter: Payer: BC Managed Care – PPO | Admitting: Physical Therapy

## 2013-05-06 ENCOUNTER — Encounter: Payer: BC Managed Care – PPO | Admitting: Physical Therapy

## 2016-10-17 LAB — PSA: PSA: 0.4

## 2016-12-18 ENCOUNTER — Other Ambulatory Visit: Payer: Self-pay | Admitting: Internal Medicine

## 2016-12-18 DIAGNOSIS — R5381 Other malaise: Secondary | ICD-10-CM

## 2016-12-19 ENCOUNTER — Other Ambulatory Visit: Payer: Self-pay | Admitting: Internal Medicine

## 2016-12-19 DIAGNOSIS — E291 Testicular hypofunction: Secondary | ICD-10-CM

## 2017-01-16 ENCOUNTER — Ambulatory Visit
Admission: RE | Admit: 2017-01-16 | Discharge: 2017-01-16 | Disposition: A | Discharge status: Home / Self Care | Payer: BLUE CROSS/BLUE SHIELD | Source: Ambulatory Visit | Attending: Internal Medicine | Admitting: Internal Medicine

## 2017-01-16 ENCOUNTER — Other Ambulatory Visit: Payer: Self-pay

## 2017-01-16 DIAGNOSIS — E291 Testicular hypofunction: Secondary | ICD-10-CM

## 2017-04-17 DIAGNOSIS — E785 Hyperlipidemia, unspecified: Secondary | ICD-10-CM | POA: Insufficient documentation

## 2017-04-17 LAB — BASIC METABOLIC PANEL
BUN: 21 (ref 4–21)
Creatinine: 1 (ref 0.6–1.3)
Glucose: 95
Potassium: 4.3 (ref 3.4–5.3)
Sodium: 138 (ref 137–147)

## 2017-04-17 LAB — TSH: TSH: 5.02 (ref 0.41–5.90)

## 2017-04-17 LAB — HEMOGLOBIN A1C: Hemoglobin A1C: 6.1

## 2017-11-08 ENCOUNTER — Encounter: Payer: Self-pay | Admitting: Internal Medicine

## 2017-11-08 DIAGNOSIS — E785 Hyperlipidemia, unspecified: Secondary | ICD-10-CM

## 2017-11-13 ENCOUNTER — Encounter: Payer: Self-pay | Admitting: Internal Medicine

## 2017-11-13 ENCOUNTER — Ambulatory Visit (INDEPENDENT_AMBULATORY_CARE_PROVIDER_SITE_OTHER): Payer: PRIVATE HEALTH INSURANCE | Admitting: Internal Medicine

## 2017-11-13 VITALS — BP 114/78 | HR 70 | Temp 98.0°F | Ht 72.5 in | Wt 181.6 lb

## 2017-11-13 DIAGNOSIS — Z0000 Encounter for general adult medical examination without abnormal findings: Secondary | ICD-10-CM | POA: Diagnosis not present

## 2017-11-13 DIAGNOSIS — Z1212 Encounter for screening for malignant neoplasm of rectum: Secondary | ICD-10-CM

## 2017-11-13 DIAGNOSIS — Z712 Person consulting for explanation of examination or test findings: Secondary | ICD-10-CM | POA: Diagnosis not present

## 2017-11-13 LAB — POC HEMOCCULT BLD/STL (OFFICE/1-CARD/DIAGNOSTIC): Fecal Occult Blood, POC: NEGATIVE

## 2017-11-13 MED ORDER — DOXYCYCLINE MONOHYDRATE 100 MG PO TABS: 100.0000 mg | ORAL_TABLET | Freq: Two times a day (BID) | ORAL | 0 refills | Status: DC

## 2017-11-13 MED ORDER — METRONIDAZOLE 500 MG PO TABS: 500.0000 mg | ORAL_TABLET | Freq: Two times a day (BID) | ORAL | 0 refills | Status: AC

## 2017-11-13 MED ORDER — ACETAZOLAMIDE 125 MG PO TABS: 125.0000 mg | ORAL_TABLET | Freq: Two times a day (BID) | ORAL | 0 refills | Status: DC

## 2017-11-13 NOTE — Progress Notes (Signed)
Men's preventive visit. Patient Health Questionnaire (PHQ-2) is    Office Visit from 11/13/2017 in [facility]  PHQ-2 Total Score  0    . Patient is on a blood type B diet. Marital status: Married. Relevant history for alcohol use is:  Social History   Substance and Sexual Activity  Alcohol Use No  . Relevant history for tobacco use is: no.  Social History   Tobacco Use  Smoking Status Never Smoker  Smokeless Tobacco Never Used  .  Subjective:     Patient ID: Devon Fowler , male    DOB: [birth year] , 57 y.o.   MRN: [id number]   HE IS HERE TODAY FOR A FULL PHYSICAL EXAM. HE IS HEADED TO [place] 12/11/17.    Past Medical History:  Diagnosis Date  . High cholesterol       Current Outpatient Medications:  .  aspirin (ASPIR-LOW) 81 MG EC tablet, Take 81 mg by mouth daily. Swallow whole., Disp: , Rfl:  .  Cholecalciferol (VITAMIN D-3) 5000 UNIT/ML LIQD, Place under the tongue., Disp: , Rfl:  .  Coenzyme Q10 (CO Q-10) 200 MG CAPS, Take 1 capsule by mouth 2 (two) times daily., Disp: , Rfl:  .  Dorzolamide HCl-Timolol Mal PF 22.3-6.8 MG/ML SOLN, Apply 1 drop to eye 2 (two) times daily., Disp: , Rfl:  .  finasteride (PROSCAR) 5 MG tablet, Take 2.5 mg by mouth daily., Disp: , Rfl:  .  latanoprost (XALATAN) 0.005 % ophthalmic solution, Place 1 drop into both eyes at bedtime., Disp: , Rfl:  .  Menaquinone-7 (VITAMIN K2 PO), Take by mouth every morning., Disp: , Rfl:  .  milk thistle 175 MG tablet, Take 175 mg by mouth daily., Disp: , Rfl:  .  Multiple Vitamin (MULTIVITAMIN) tablet, Take 1 tablet by mouth daily., Disp: , Rfl:  .  omega-3 acid ethyl esters (LOVAZA) 1 g capsule, Take 1 g by mouth daily., Disp: , Rfl:  .  pravastatin (PRAVACHOL) 40 MG tablet, Take 40 mg by mouth daily., Disp: , Rfl:  .  Resveratrol 250 MG CAPS, Take 1 capsule by mouth at bedtime., Disp: , Rfl:  .  Turmeric 500 MG CAPS, Take by mouth., Disp: , Rfl:  .  vitamin A 40981 UNIT capsule, Take  10,000 Units by mouth every morning., Disp: , Rfl:  .  vitamin E (VITAMIN E) 400 UNIT capsule, Take 400 Units by mouth every morning., Disp: , Rfl:    Review of Systems  Constitutional: Negative.   HENT: Negative.   Eyes: Negative.   Respiratory: Negative.   Cardiovascular: Negative.   Gastrointestinal: Negative.   Endocrine: Negative.   Genitourinary: Negative.   Hematological: Negative.   Psychiatric/Behavioral: Negative.      Today's Vitals   11/13/17 1127  BP: 114/78  Pulse: 70  Temp: 98 F (36.7 C)  TempSrc: Oral  Weight: 181 lb 9.6 oz (82.4 kg)  Height: 6' 0.5" (1.842 m)   Body mass index is 24.29 kg/m.   Objective:  Physical Exam  Constitutional: He is oriented to person, place, and time. He appears well-developed and well-nourished.  HENT:  Head: Normocephalic and atraumatic.  Right Ear: External ear normal.  Left Ear: External ear normal.  Nose: Nose normal.  Mouth/Throat: Oropharynx is clear and moist.  Eyes: Pupils are equal, round, and reactive to light. Conjunctivae and EOM are normal.  Neck: Normal range of motion. Neck supple.  Cardiovascular: Normal rate, regular rhythm, normal heart sounds and  intact distal pulses.  Pulmonary/Chest: Effort normal and breath sounds normal.  Abdominal: Soft. Bowel sounds are normal. He exhibits no distension and no mass. There is no tenderness. There is no guarding.  Genitourinary: Rectal exam shows guaiac negative stool.  Musculoskeletal: Normal range of motion.  Neurological: He is alert and oriented to person, place, and time. No cranial nerve deficit.  Skin: Skin is warm and dry.  Psychiatric: He has a normal mood and affect. His behavior is normal.  Nursing note and vitals reviewed.       Assessment And Plan:     1. Routine general medical examination at a health care facility A FULL EXAM WAS PERFORMED.  DRE PERFORMED, STOOL HEME NEGATIVE.  RECENT IMMUNIZATIONS REVIEWED IN FULL DETAIL. I ALSO REVIEWED LABWORK  HE BROUGHT IN FOR REVIEW. THIS WILL BE SCANNED INTO HIS CHART.     PATIENT HAS BEEN ADVISED TO GET 30-45 MINUTES REGULAR EXERCISE NO LESS THAN FOUR TO FIVE DAYS PER WEEK - BOTH WEIGHTBEARING EXERCISES AND AEROBIC ARE RECOMMENDED.  HE WAS ADVISED TO FOLLOW A HEALTHY DIET WITH AT LEAST SIX FRUITS/VEGGIES PER DAY, DECREASE INTAKE OF RED MEAT, AND TO INCREASE FISH INTAKE TO TWO DAYS PER WEEK.  MEATS/FISH SHOULD NOT BE FRIED, BAKED OR BROILED IS PREFERABLE.  I SUGGEST WEARING SPF 50 SUNSCREEN ON EXPOSED PARTS AND ESPECIALLY WHEN IN THE DIRECT SUNLIGHT FOR AN EXTENDED PERIOD OF TIME.  PLEASE AVOID FAST FOOD RESTAURANTS AND INCREASE YOUR WATER INTAKE.   HE WAS ALSO GIVEN RX FOR MEDS TO TAKE ON HIS TRIP TO [place] - DOXYCYCLINE, ACETAZOLAMIDE AND METRONIDAZOLE - AS REQUESTED.   Gwynneth Aliment, MD

## 2017-11-16 ENCOUNTER — Encounter: Payer: Self-pay | Admitting: Internal Medicine

## 2018-02-01 ENCOUNTER — Other Ambulatory Visit: Payer: Self-pay | Admitting: Internal Medicine

## 2018-04-04 ENCOUNTER — Encounter: Payer: Self-pay | Admitting: Internal Medicine

## 2018-04-09 ENCOUNTER — Encounter: Payer: Self-pay | Admitting: Internal Medicine

## 2018-04-09 ENCOUNTER — Ambulatory Visit (INDEPENDENT_AMBULATORY_CARE_PROVIDER_SITE_OTHER): Payer: BLUE CROSS/BLUE SHIELD | Admitting: Internal Medicine

## 2018-04-09 ENCOUNTER — Encounter: Payer: No Typology Code available for payment source | Admitting: Internal Medicine

## 2018-04-09 VITALS — BP 116/68 | HR 55 | Temp 97.7°F | Ht 72.6 in | Wt 178.8 lb

## 2018-04-09 DIAGNOSIS — Z0000 Encounter for general adult medical examination without abnormal findings: Secondary | ICD-10-CM

## 2018-04-09 DIAGNOSIS — Z1212 Encounter for screening for malignant neoplasm of rectum: Secondary | ICD-10-CM

## 2018-04-09 LAB — POCT URINALYSIS DIPSTICK
Bilirubin, UA: NEGATIVE
Blood, UA: NEGATIVE
Glucose, UA: NEGATIVE
Ketones, UA: NEGATIVE
Leukocytes, UA: NEGATIVE
Nitrite, UA: NEGATIVE
Protein, UA: NEGATIVE
Spec Grav, UA: 1.01 (ref 1.010–1.025)
Urobilinogen, UA: 0.2 E.U./dL
pH, UA: 6.5 (ref 5.0–8.0)

## 2018-04-09 NOTE — Patient Instructions (Signed)

## 2018-04-11 LAB — POC HEMOCCULT BLD/STL (OFFICE/1-CARD/DIAGNOSTIC)
Card #1 Date: 3302020
Fecal Occult Blood, POC: NEGATIVE

## 2018-04-11 NOTE — Progress Notes (Addendum)
Subjective:     Patient ID: Devon Fowler , male    DOB: 03-19-1960 , 58 y.o.   MRN: [id number]   Chief Complaint  Patient presents with  . Annual Exam    HPI  He is here today for a full physical exam. He has brought in bloodwork he had performed to develop his personalized vitamins. His wife practices functional medicine. He also has a list of labs he would like to have performed today.     Past Medical History:  Diagnosis Date  . High cholesterol      Family History  Problem Relation Age of Onset  . Diabetes Mother   . Heart attack Father      Current Outpatient Medications:  .  aspirin (ASPIR-LOW) 81 MG EC tablet, Take 81 mg by mouth daily. Swallow whole., Disp: , Rfl:  .  Cholecalciferol (VITAMIN D-3) 5000 UNIT/ML LIQD, Place under the tongue., Disp: , Rfl:  .  Coenzyme Q10 (CO Q-10) 200 MG CAPS, Take 1 capsule by mouth 2 (two) times daily., Disp: , Rfl:  .  Dorzolamide HCl-Timolol Mal PF 22.3-6.8 MG/ML SOLN, Apply 1 drop to eye 2 (two) times daily., Disp: , Rfl:  .  finasteride (PROSCAR) 5 MG tablet, Take 2.5 mg by mouth daily., Disp: , Rfl:  .  latanoprost (XALATAN) 0.005 % ophthalmic solution, Place 1 drop into both eyes at bedtime., Disp: , Rfl:  .  metFORMIN (GLUCOPHAGE-XR) 500 MG 24 hr tablet, TAKE ONE TABLET BY MOUTH EACH DAY WITH EVENING MEAL, Disp: 180 tablet, Rfl: 1 .  milk thistle 175 MG tablet, Take 175 mg by mouth daily., Disp: , Rfl:  .  Multiple Vitamin (MULTIVITAMIN) tablet, Take 1 tablet by mouth daily., Disp: , Rfl:  .  omega-3 acid ethyl esters (LOVAZA) 1 g capsule, Take 1 g by mouth daily., Disp: , Rfl:  .  pravastatin (PRAVACHOL) 40 MG tablet, TAKE ONE TABLET BY MOUTH EACH DAY, Disp: 90 tablet, Rfl: 1 .  Resveratrol 250 MG CAPS, Take 1 capsule by mouth at bedtime., Disp: , Rfl:  .  Turmeric 500 MG CAPS, Take by mouth., Disp: , Rfl:  .  vitamin A 10000 UNIT capsule, Take 10,000 Units by mouth every morning., Disp: , Rfl:  .  vitamin E (VITAMIN E) 400  UNIT capsule, Take 400 Units by mouth every morning., Disp: , Rfl:  .  Menaquinone-7 (VITAMIN K2 PO), Take by mouth every morning., Disp: , Rfl:    Allergies  Allergen Reactions  . Lamisil [Terbinafine] Hives     Men's preventive visit. Patient Health Questionnaire (PHQ-2) is    Office Visit from 04/09/2018 in [facility]  PHQ-2 Total Score  0    . Patient is on a healthy diet. Marital status: Married. Relevant history for alcohol use is:  Social History   Substance and Sexual Activity  Alcohol Use No  . Relevant history for tobacco use is:  Social History   Tobacco Use  Smoking Status Never Smoker  Smokeless Tobacco Never Used  .  Review of Systems  Constitutional: Negative.   HENT: Negative.   Eyes: Negative.   Respiratory: Negative.   Cardiovascular: Negative.   Gastrointestinal: Negative.   Endocrine: Negative.   Genitourinary: Negative.   Musculoskeletal: Negative.   Skin: Negative.   Allergic/Immunologic: Negative.   Neurological: Negative.   Hematological: Negative.   Psychiatric/Behavioral: Negative.      Today's Vitals   04/09/18 1142  BP: 116/68  Pulse: Marland Kitchen)  55  Temp: 97.7 F (36.5 C)  TempSrc: Oral  Weight: 178 lb 12.8 oz (81.1 kg)  Height: 6' 0.6" (1.844 m)   Body mass index is 23.85 kg/m.   Objective:  Physical Exam Vitals signs and nursing note reviewed.  Constitutional:      Appearance: Normal appearance.  HENT:     Head: Normocephalic and atraumatic.     Right Ear: Tympanic membrane, ear canal and external ear normal.     Left Ear: Tympanic membrane, ear canal and external ear normal.     Nose: Nose normal.     Mouth/Throat:     Mouth: Mucous membranes are moist.     Pharynx: Oropharynx is clear.  Eyes:     Extraocular Movements: Extraocular movements intact.     Conjunctiva/sclera: Conjunctivae normal.     Pupils: Pupils are equal, round, and reactive to light.  Neck:     Musculoskeletal: Normal range of  motion and neck supple.  Cardiovascular:     Rate and Rhythm: Normal rate and regular rhythm.     Pulses: Normal pulses.     Heart sounds: Normal heart sounds.  Pulmonary:     Effort: Pulmonary effort is normal.     Breath sounds: Normal breath sounds.  Chest:     Breasts:        Right: Normal. No swelling, bleeding, inverted nipple, mass, nipple discharge or skin change.        Left: Normal. No swelling, bleeding, inverted nipple, mass, nipple discharge or skin change.  Abdominal:     General: Abdomen is flat. Bowel sounds are normal.     Palpations: Abdomen is soft.  Genitourinary:    Rectum: Normal. Guaiac result negative.     Comments: BPH Musculoskeletal: Normal range of motion.  Skin:    General: Skin is warm.     Capillary Refill: Capillary refill takes less than 2 seconds.  Neurological:     General: No focal deficit present.     Mental Status: He is alert.  Psychiatric:        Mood and Affect: Mood normal.         Assessment And Plan:     1. Routine general medical examination at health care facility  A full exam was performed.  DRE performed, stool is guiaic negative. Labs ordered as requested. He has also agreed to saliva testing for hormones. He is interested in optimizing his testosterone levels. I will also request his colonoscopy report to update his records. PATIENT HAS BEEN ADVISED TO GET 30-45 MINUTES REGULAR EXERCISE NO LESS THAN FOUR TO FIVE DAYS PER WEEK - BOTH WEIGHTBEARING EXERCISES AND AEROBIC ARE RECOMMENDED.  HE IS ADVISED TO FOLLOW A HEALTHY DIET WITH AT LEAST SIX FRUITS/VEGGIES PER DAY, DECREASE INTAKE OF RED MEAT, AND TO INCREASE FISH INTAKE TO TWO DAYS PER WEEK.  MEATS/FISH SHOULD NOT BE FRIED, BAKED OR BROILED IS PREFERABLE.  I SUGGEST WEARING SPF 50 SUNSCREEN ON EXPOSED PARTS AND ESPECIALLY WHEN IN THE DIRECT SUNLIGHT FOR AN EXTENDED PERIOD OF TIME.  PLEASE AVOID FAST FOOD RESTAURANTS AND INCREASE YOUR WATER INTAKE.  - CMP14+EGFR - CBC -  Hemoglobin A1c - NMR LipoProf + Graph - Apolipoprotein B - High sensitivity CRP - Homocysteine - TSH - T3, free - Testosterone, free - CK, total - Uric acid - Folate, RBC and Serum - Vitamin A - Vitamin E - Vitamin D (25 hydroxy) - Vitamin D 1,25 Dihydroxy - Magnesium, RBC - Zinc - Selenium  serum - Coenzyme Q10, Total - DHEA - PSA - Estradiol - POCT Urinalysis Dipstick (16619)    Maximino Greenland, MD

## 2018-04-12 NOTE — Addendum Note (Signed)
Addended by: Gwynneth Aliment on: 04/12/2018 06:26 PM   Modules accepted: Orders

## 2018-04-15 LAB — NMR LIPOPROF + GRAPH
Cholesterol, Total: 203 mg/dL — ABNORMAL HIGH (ref 100–199)
HDL Particle Number: 26.8 umol/L — ABNORMAL LOW (ref >=30.5)
HDL-C: 45 mg/dL (ref >39)
LDL Particle Number: 1926 nmol/L — ABNORMAL HIGH (ref <1000)
LDL Size: 20.5 nm — ABNORMAL LOW (ref >20.5)
LDL-C: 135 mg/dL — ABNORMAL HIGH (ref 0–99)
LP-IR Score: 49 — ABNORMAL HIGH (ref <=45)
Small LDL Particle Number: 1238 nmol/L — ABNORMAL HIGH (ref <=527)
Triglycerides: 116 mg/dL (ref 0–149)

## 2018-04-15 LAB — DHEA: Dehydroepiandrosterone: 124 ng/dL (ref 31–701)

## 2018-04-15 LAB — VITAMIN D 1,25 DIHYDROXY
Vitamin D 1, 25 (OH)2 Total: 37 pg/mL
Vitamin D2 1, 25 (OH)2: <10 pg/mL
Vitamin D3 1, 25 (OH)2: 36 pg/mL

## 2018-04-15 LAB — VITAMIN E
Vitamin E (Alpha Tocopherol): 31.3 mg/L — ABNORMAL HIGH (ref 7.0–25.1)
Vitamin E(Gamma Tocopherol): 0.3 mg/L — ABNORMAL LOW (ref 0.5–5.5)

## 2018-04-15 LAB — CMP14+EGFR
ALT: 23 IU/L (ref 0–44)
AST: 30 IU/L (ref 0–40)
Albumin/Globulin Ratio: 1.7 (ref 1.2–2.2)
Albumin: 5.1 g/dL — ABNORMAL HIGH (ref 3.8–4.9)
Alkaline Phosphatase: 46 IU/L (ref 39–117)
BUN/Creatinine Ratio: 13 (ref 9–20)
BUN: 15 mg/dL (ref 6–24)
Bilirubin Total: 0.8 mg/dL (ref 0.0–1.2)
CO2: 27 mmol/L (ref 20–29)
Calcium: 9.8 mg/dL (ref 8.7–10.2)
Chloride: 98 mmol/L (ref 96–106)
Creatinine, Ser: 1.15 mg/dL (ref 0.76–1.27)
GFR calc Af Amer: 80 mL/min/{1.73_m2} (ref >59)
GFR calc non Af Amer: 69 mL/min/{1.73_m2} (ref >59)
Globulin, Total: 3 g/dL (ref 1.5–4.5)
Glucose: 90 mg/dL (ref 65–99)
Potassium: 4.2 mmol/L (ref 3.5–5.2)
Sodium: 139 mmol/L (ref 134–144)
Total Protein: 8.1 g/dL (ref 6.0–8.5)

## 2018-04-15 LAB — VITAMIN D 25 HYDROXY (VIT D DEFICIENCY, FRACTURES): Vit D, 25-Hydroxy: 46.2 ng/mL (ref 30.0–100.0)

## 2018-04-15 LAB — MAGNESIUM, RBC: Magnesium RBC: 4.8 mg/dL (ref 4.2–6.8)

## 2018-04-15 LAB — FOLATE, RBC AND SERUM
Folate, Hemolysate: >620 ng/mL
Folate, RBC: >1286 ng/mL (ref >498)
Folate: >20 ng/mL (ref >3.0)

## 2018-04-15 LAB — ZINC: Zinc: 107 ug/dL (ref 56–134)

## 2018-04-15 LAB — VITAMIN A: Vitamin A: 66.7 ug/dL — ABNORMAL HIGH (ref 20.1–62.0)

## 2018-04-15 LAB — URIC ACID: Uric Acid: 6.5 mg/dL (ref 3.7–8.6)

## 2018-04-15 LAB — TSH: TSH: 4.16 u[IU]/mL (ref 0.450–4.500)

## 2018-04-15 LAB — HEMOGLOBIN A1C
Est. average glucose Bld gHb Est-mCnc: 117 mg/dL
Hgb A1c MFr Bld: 5.7 % — ABNORMAL HIGH (ref 4.8–5.6)

## 2018-04-15 LAB — TESTOSTERONE, FREE: Testosterone, Free: 10.4 pg/mL (ref 7.2–24.0)

## 2018-04-15 LAB — CBC
Hematocrit: 48.2 % (ref 37.5–51.0)
Hemoglobin: 15.3 g/dL (ref 13.0–17.7)
MCH: 24.8 pg — ABNORMAL LOW (ref 26.6–33.0)
MCHC: 31.7 g/dL (ref 31.5–35.7)
MCV: 78 fL — ABNORMAL LOW (ref 79–97)
Platelets: 232 10*3/uL (ref 150–450)
RBC: 6.18 x10E6/uL — ABNORMAL HIGH (ref 4.14–5.80)
RDW: 14.5 % (ref 11.6–15.4)
WBC: 6.1 10*3/uL (ref 3.4–10.8)

## 2018-04-15 LAB — ESTRADIOL: Estradiol: 24.2 pg/mL (ref 7.6–42.6)

## 2018-04-15 LAB — APOLIPOPROTEIN B: Apolipoprotein B: 135 mg/dL — ABNORMAL HIGH (ref <90)

## 2018-04-15 LAB — PSA: Prostate Specific Ag, Serum: 0.3 ng/mL (ref 0.0–4.0)

## 2018-04-15 LAB — HOMOCYSTEINE: Homocysteine: 11.8 umol/L (ref 0.0–14.5)

## 2018-04-15 LAB — COENZYME Q10, TOTAL: Coenzyme Q10, Total: 2.73 ug/mL — ABNORMAL HIGH (ref 0.37–2.20)

## 2018-04-15 LAB — OXIDIZED LDL: Oxidized LDL: 44 ng/mL (ref 10–170)

## 2018-04-15 LAB — HIGH SENSITIVITY CRP: CRP, High Sensitivity: 0.63 mg/L (ref 0.00–3.00)

## 2018-04-15 LAB — SELENIUM SERUM: Selenium, S/P: 198 ug/L (ref 91–198)

## 2018-04-15 LAB — CK: Total CK: 217 U/L — ABNORMAL HIGH (ref 24–204)

## 2018-04-15 LAB — T3, FREE: T3, Free: 2.6 pg/mL (ref 2.0–4.4)

## 2018-04-25 ENCOUNTER — Encounter: Payer: Self-pay | Admitting: Internal Medicine

## 2018-04-29 ENCOUNTER — Encounter: Payer: Self-pay | Admitting: Internal Medicine

## 2018-05-06 ENCOUNTER — Encounter: Payer: Self-pay | Admitting: Internal Medicine

## 2018-05-06 NOTE — Telephone Encounter (Signed)
Please schedule him for Webex next Friday at 1130 for BHRT results. Thanks

## 2018-05-09 ENCOUNTER — Encounter: Payer: Self-pay | Admitting: Internal Medicine

## 2018-05-10 ENCOUNTER — Other Ambulatory Visit: Payer: Self-pay

## 2018-05-10 MED ORDER — METFORMIN HCL ER 500 MG PO TB24: ORAL_TABLET | ORAL | 2 refills | Status: DC

## 2018-05-10 MED ORDER — PRAVASTATIN SODIUM 40 MG PO TABS: ORAL_TABLET | ORAL | 2 refills | Status: DC

## 2018-05-14 ENCOUNTER — Telehealth: Payer: Self-pay | Admitting: Internal Medicine

## 2018-05-14 ENCOUNTER — Encounter: Payer: Self-pay | Admitting: Internal Medicine

## 2018-05-14 ENCOUNTER — Other Ambulatory Visit: Payer: Self-pay

## 2018-05-14 ENCOUNTER — Ambulatory Visit (INDEPENDENT_AMBULATORY_CARE_PROVIDER_SITE_OTHER): Payer: BLUE CROSS/BLUE SHIELD | Admitting: Internal Medicine

## 2018-05-14 VITALS — Ht 72.6 in | Wt 177.0 lb

## 2018-05-14 DIAGNOSIS — N529 Male erectile dysfunction, unspecified: Secondary | ICD-10-CM

## 2018-05-14 DIAGNOSIS — E7800 Pure hypercholesterolemia, unspecified: Secondary | ICD-10-CM

## 2018-05-14 DIAGNOSIS — E8881 Metabolic syndrome: Secondary | ICD-10-CM

## 2018-05-14 DIAGNOSIS — E291 Testicular hypofunction: Secondary | ICD-10-CM

## 2018-05-14 NOTE — Telephone Encounter (Signed)
Patient has agreed to do a virtual visit with Dr. Allyne Gee for Appointment 05/14/18

## 2018-05-27 ENCOUNTER — Encounter: Payer: Self-pay | Admitting: Internal Medicine

## 2018-06-08 NOTE — Progress Notes (Signed)
Virtual Visit via Video   This visit type was conducted due to national recommendations for restrictions regarding the COVID-19 Pandemic (e.g. social distancing) in an effort to limit this patient's exposure and mitigate transmission in our community.  Due to his co-morbid illnesses, this patient is at least at moderate risk for complications without adequate follow up.  This format is felt to be most appropriate for this patient at this time.  All issues noted in this document were discussed and addressed.  A limited physical exam was performed with this format.    This visit type was conducted due to national recommendations for restrictions regarding the COVID-19 Pandemic (e.g. social distancing) in an effort to limit this patient's exposure and mitigate transmission in our community.  Patients identity confirmed using two different identifiers.  This format is felt to be most appropriate for this patient at this time.  All issues noted in this document were discussed and addressed.  No physical exam was performed (except for noted visual exam findings with Video Visits).    Date:  06/08/2018   ID:  Devon Fowler, DOB December 22, 1960, MRN [id number]  Patient Location:  Home  Provider location:   Office    Chief Complaint:  BHRT f/u -- wants saliva test results  History of Present Illness:    Devon Fowler is a 58 y.o. male who presents via video conferencing for a telehealth visit today.    The patient does not have symptoms concerning for COVID-19 infection (fever, chills, cough, or new shortness of breath).   He presents today for a virtual video visit. He has chosen this method of contact due to COVID-19 pandemic. He is anxiously awaiting the results of his recent saliva test. He believes he is in need of supplemental testosterone. He reports symptoms of ED, decreased stamina and increased joint pains. He adds that he is able to lift weights without difficulty.     Past Medical History:   Diagnosis Date  . High cholesterol    Past Surgical History:  Procedure Laterality Date  . ORIF ANKLE FRACTURE Left 02/14/2013   DR Roda Shutters  . ORIF ANKLE FRACTURE Left 02/14/2013   Procedure: OPEN REDUCTION INTERNAL FIXATION (ORIF) ANKLE FRACTURE;  Surgeon: Cheral Almas, MD;  Location: [facility] OR;  Service: Orthopedics;  Laterality: Left;  Marland Kitchen VASECTOMY  2003     Current Meds  Medication Sig  . aspirin (ASPIR-LOW) 81 MG EC tablet Take 81 mg by mouth daily. Swallow whole.  . Cholecalciferol (VITAMIN D-3) 5000 UNIT/ML LIQD Place under the tongue.  . Coenzyme Q10 (CO Q-10) 200 MG CAPS Take 1 capsule by mouth 2 (two) times daily.  . Dorzolamide HCl-Timolol Mal PF 22.3-6.8 MG/ML SOLN Apply 1 drop to eye 2 (two) times daily.  . finasteride (PROSCAR) 5 MG tablet Take 2.5 mg by mouth daily.  Marland Kitchen latanoprost (XALATAN) 0.005 % ophthalmic solution Place 1 drop into both eyes at bedtime.  . Menaquinone-7 (VITAMIN K2 PO) Take by mouth every morning.  . metFORMIN (GLUCOPHAGE-XR) 500 MG 24 hr tablet TAKE ONE TABLET BY MOUTH 2 TIMES PER DAY  . milk thistle 175 MG tablet Take 175 mg by mouth daily.  . Multiple Vitamin (MULTIVITAMIN) tablet Take 1 tablet by mouth daily.  Marland Kitchen omega-3 acid ethyl esters (LOVAZA) 1 g capsule Take 1 g by mouth daily.  Marland Kitchen Resveratrol 250 MG CAPS Take 1 capsule by mouth at bedtime.  . rosuvastatin (CRESTOR) 40 MG tablet Take 40 mg by  mouth daily.  . Turmeric 500 MG CAPS Take by mouth.  . vitamin A 93790 UNIT capsule Take 10,000 Units by mouth every morning.  . vitamin E (VITAMIN E) 400 UNIT capsule Take 400 Units by mouth every morning.     Allergies:   Lamisil [terbinafine]   Social History   Tobacco Use  . Smoking status: Never Smoker  . Smokeless tobacco: Never Used  Substance Use Topics  . Alcohol use: No  . Drug use: No     Family Hx: The patient's family history includes Diabetes in his mother; Heart attack in his father.  ROS:   Please see the history of present  illness.    Review of Systems  Constitutional: Negative.   Respiratory: Negative.   Cardiovascular: Negative.   Gastrointestinal: Negative.   Genitourinary:       Pos ED--difficulty achieving and maintaining an erection  Neurological: Negative.   Psychiatric/Behavioral: Negative.     All other systems reviewed and are negative.   Labs/Other Tests and Data Reviewed:    Recent Labs: 04/09/2018: ALT 23; BUN 15; Creatinine, Ser 1.15; Hemoglobin 15.3; Platelets 232; Potassium 4.2; Sodium 139; TSH 4.160   Recent Lipid Panel No results found for: CHOL, TRIG, HDL, CHOLHDL, LDLCALC, LDLDIRECT  Wt Readings from Last 3 Encounters:  05/14/18 177 lb (80.3 kg)  04/09/18 178 lb 12.8 oz (81.1 kg)  11/13/17 181 lb 9.6 oz (82.4 kg)     Exam:    Vital Signs:  Ht 6' 0.6" (1.844 m)   Wt 177 lb (80.3 kg) Comment: pt provided  BMI 23.61 kg/m     Physical Exam  Constitutional: He is oriented to person, place, and time and well-developed, well-nourished, and in no distress.  HENT:  Head: Normocephalic and atraumatic.  Neck: Normal range of motion.  Pulmonary/Chest: Effort normal.  Neurological: He is alert and oriented to person, place, and time.  Psychiatric: Affect normal.  Nursing note and vitals reviewed.   ASSESSMENT & PLAN:     1. Erectile dysfunction, unspecified erectile dysfunction type  I went over his saliva test results in full detail. They were significant for nl estradiol levels, low normal testosterone levels and low DHEA levels. He plans to continue with oral DHEA supplementation 5mg  twice daily.    2. Hypogonadism male Please see #2.   3. Insulin resistance  After his most recent labwork, he has since added berberine 500mg  twice daily to his supplement regimen. He is no longer on metformin.  He is following his blood sugars with Freestyle Libre.     4. Pure hypercholesterolemia  He has been evaluated by preventive cardiology at [facility]. He is no longer on  pravastatin and has been switched to rosuvastatin daily. He has not yet experienced any side effects from the rosuvastatin.   COVID-19 Education: The signs and symptoms of COVID-19 were discussed with the patient and how to seek care for testing (follow up with PCP or arrange E-visit).  The importance of social distancing was discussed today.  Patient Risk:   After full review of this patients clinical status, I feel that they are at least moderate risk at this time.  Time:   Today, I have spent 11 minutes/ 27 seconds with the patient with telehealth technology discussing above diagnoses.     Medication Adjustments/Labs and Tests Ordered: Current medicines are reviewed at length with the patient today.  Concerns regarding medicines are outlined above.   Tests Ordered: No orders of the defined types  were placed in this encounter.   Medication Changes: No orders of the defined types were placed in this encounter.   Disposition:  Follow up in 6 week(s)  Signed, Gwynneth Alimentobyn N Shenica Holzheimer, MD

## 2018-06-17 ENCOUNTER — Encounter: Payer: Self-pay | Admitting: Internal Medicine

## 2018-09-12 ENCOUNTER — Other Ambulatory Visit: Payer: Self-pay | Admitting: Internal Medicine

## 2018-09-13 ENCOUNTER — Telehealth: Payer: Self-pay

## 2018-09-13 NOTE — Telephone Encounter (Signed)
Left vm for pt to call back to schedule appt

## 2018-09-16 ENCOUNTER — Ambulatory Visit: Payer: Self-pay | Admitting: Internal Medicine

## 2018-09-16 ENCOUNTER — Encounter: Payer: Self-pay | Admitting: Internal Medicine

## 2018-09-16 ENCOUNTER — Other Ambulatory Visit: Payer: Self-pay

## 2018-09-16 VITALS — BP 108/72 | HR 40 | Temp 97.5°F | Ht 72.6 in | Wt 174.4 lb

## 2018-09-16 DIAGNOSIS — E559 Vitamin D deficiency, unspecified: Secondary | ICD-10-CM

## 2018-09-16 DIAGNOSIS — E7800 Pure hypercholesterolemia, unspecified: Secondary | ICD-10-CM

## 2018-09-16 DIAGNOSIS — R946 Abnormal results of thyroid function studies: Secondary | ICD-10-CM

## 2018-09-16 DIAGNOSIS — Z79899 Other long term (current) drug therapy: Secondary | ICD-10-CM

## 2018-09-16 DIAGNOSIS — N489 Disorder of penis, unspecified: Secondary | ICD-10-CM

## 2018-09-16 DIAGNOSIS — E291 Testicular hypofunction: Secondary | ICD-10-CM

## 2018-09-16 NOTE — Patient Instructions (Signed)

## 2018-09-19 ENCOUNTER — Encounter: Payer: Self-pay | Admitting: Internal Medicine

## 2018-09-20 LAB — INSULIN, RANDOM: INSULIN: 2.7 u[IU]/mL (ref 2.6–24.9)

## 2018-09-20 LAB — DHEA-SULFATE: DHEA-SO4: 69.2 ug/dL (ref 48.9–344.2)

## 2018-09-20 LAB — TESTOSTERONE,FREE AND TOTAL
Testosterone, Free: 11.6 pg/mL (ref 7.2–24.0)
Testosterone: 886 ng/dL (ref 264–916)

## 2018-09-20 LAB — HOMOCYSTEINE: Homocysteine: 10.6 umol/L (ref 0.0–14.5)

## 2018-09-20 LAB — THYROID PANEL WITH TSH
Free Thyroxine Index: 2.3 (ref 1.2–4.9)
T3 Uptake Ratio: 30 % (ref 24–39)
T4, Total: 7.5 ug/dL (ref 4.5–12.0)
TSH: 3.7 u[IU]/mL (ref 0.450–4.500)

## 2018-09-20 LAB — HEMOGLOBIN A1C
Est. average glucose Bld gHb Est-mCnc: 123 mg/dL
Hgb A1c MFr Bld: 5.9 % — ABNORMAL HIGH (ref 4.8–5.6)

## 2018-09-20 LAB — HIGH SENSITIVITY CRP: CRP, High Sensitivity: 0.72 mg/L (ref 0.00–3.00)

## 2018-09-20 LAB — VITAMIN D 25 HYDROXY (VIT D DEFICIENCY, FRACTURES): Vit D, 25-Hydroxy: 52.3 ng/mL (ref 30.0–100.0)

## 2018-09-20 LAB — VITAMIN E
Vitamin E (Alpha Tocopherol): 14.7 mg/L (ref 7.0–25.1)
Vitamin E(Gamma Tocopherol): 0.3 mg/L — ABNORMAL LOW (ref 0.5–5.5)

## 2018-09-20 LAB — OXIDIZED LDL: Oxidized LDL: 34 ng/mL (ref 10–170)

## 2018-09-20 LAB — VITAMIN A: Vitamin A: 62.6 ug/dL — ABNORMAL HIGH (ref 20.1–62.0)

## 2018-09-21 ENCOUNTER — Encounter: Payer: Self-pay | Admitting: Internal Medicine

## 2018-09-21 NOTE — Progress Notes (Signed)
Subjective:     Patient ID: Devon Fowler , male    DOB: 01/12/1961 , 58 y.o.   MRN: [id number]   Chief Complaint  Patient presents with  . Hyperlipidemia    HPI  He is here today for a lipid check. His wife is a functional medicine nutritionist and would like for him to have a [facility] drawn today. He would also like f/u on more of his lab parameters.     Past Medical History:  Diagnosis Date  . High cholesterol      Family History  Problem Relation Age of Onset  . Diabetes Mother   . Heart attack Father      Current Outpatient Medications:  .  aspirin (ASPIR-LOW) 81 MG EC tablet, Take 81 mg by mouth daily. Swallow whole., Disp: , Rfl:  .  Barberry-Oreg Grape-Goldenseal (BERBERINE COMPLEX PO), Take 500 mg by mouth. 1 2 times per day, Disp: , Rfl:  .  Cholecalciferol (VITAMIN D-3) 5000 UNIT/ML LIQD, Place under the tongue., Disp: , Rfl:  .  Continuous Blood Gluc Sensor (FREESTYLE LIBRE 14 DAY SENSOR) MISC, USE TO CHECK BLOOD SUGAR FOUR TIMES A DAY, Disp: 3 each, Rfl: 3 .  Dorzolamide HCl-Timolol Mal PF 22.3-6.8 MG/ML SOLN, Apply 1 drop to eye 2 (two) times daily., Disp: , Rfl:  .  finasteride (PROSCAR) 5 MG tablet, Take 2.5 mg by mouth daily., Disp: , Rfl:  .  latanoprost (XALATAN) 0.005 % ophthalmic solution, Place 1 drop into both eyes at bedtime., Disp: , Rfl:  .  Menaquinone-7 (VITAMIN K2 PO), Take by mouth every morning., Disp: , Rfl:  .  milk thistle 175 MG tablet, Take 175 mg by mouth daily., Disp: , Rfl:  .  Multiple Vitamin (MULTIVITAMIN) tablet, Take 1 tablet by mouth daily., Disp: , Rfl:  .  omega-3 acid ethyl esters (LOVAZA) 1 g capsule, Take 1 g by mouth daily., Disp: , Rfl:  .  Resveratrol 250 MG CAPS, Take 1 capsule by mouth at bedtime., Disp: , Rfl:  .  rosuvastatin (CRESTOR) 40 MG tablet, Take 40 mg by mouth daily., Disp: , Rfl:  .  TESTOSTERONE NA, Apply topically. compound, Disp: , Rfl:  .  vitamin E (VITAMIN E) 400 UNIT capsule, Take 400 Units by  mouth every morning., Disp: , Rfl:  .  Coenzyme Q10 (CO Q-10) 200 MG CAPS, Take 1 capsule by mouth 2 (two) times daily., Disp: , Rfl:  .  metFORMIN (GLUCOPHAGE-XR) 500 MG 24 hr tablet, TAKE ONE TABLET BY MOUTH 2 TIMES PER DAY (Patient not taking: Reported on 09/16/2018), Disp: 180 tablet, Rfl: 2   Allergies  Allergen Reactions  . Lamisil [Terbinafine] Hives     Review of Systems  Constitutional: Negative.   Respiratory: Negative.   Cardiovascular: Negative.   Gastrointestinal: Negative.   Genitourinary:       He reports having an area of concern near the shaft of his penis. He is not sure what this is. Would like Urology referral. Denies penile discharge/pain.   Neurological: Negative.   Psychiatric/Behavioral: Negative.      Today's Vitals   09/16/18 1131  BP: 108/72  Pulse: (!) 40  Temp: (!) 97.5 F (36.4 C)  TempSrc: Oral  Weight: 174 lb 6.4 oz (79.1 kg)  Height: 6' 0.6" (1.844 m)   Body mass index is 23.26 kg/m.   Objective:  Physical Exam Vitals signs and nursing note reviewed.  Constitutional:      Appearance: Normal appearance.  Cardiovascular:     Rate and Rhythm: Normal rate and regular rhythm.     Heart sounds: Normal heart sounds.  Pulmonary:     Effort: Pulmonary effort is normal.     Breath sounds: Normal breath sounds.  Genitourinary:    Comments: Deferred as per patient Skin:    General: Skin is warm.  Neurological:     General: No focal deficit present.     Mental Status: He is alert.  Psychiatric:        Mood and Affect: Mood normal.         Assessment And Plan:     1. Pure hypercholesterolemia  Pt advised that I do not have [facility] kits available. I will contact the company and request to have some sent here. I will check other labs as listed below. He was commended on his healthy lifestyle and encouraged to keep up the great work.   - Homocysteine - Hemoglobin A1c - High sensitivity CRP - Insulin, random(561) - OXIDIZED  LDL  2. Hypogonadism male  He is currently taking compounded testosterone daily. I will adjust his dose as needed.   - DHEA-sulfate - Testosterone,Free and Total  3. Vitamin D deficiency  I WILL CHECK A VIT D LEVEL AND SUPPLEMENT AS NEEDED.  ALSO ENCOURAGED TO SPEND 15 MINUTES IN THE SUN DAILY.  - Vitamin D (25 hydroxy)  4. Abnormal thyroid function test  I will check thyroid panel and initiate meds as needed.  - Thyroid Panel With TSH  5. Penile abnormality  I will refer him to Urology as requested.   - Ambulatory referral to Urology  6. Drug therapy  - Vitamin E - Vitamin A   Gwynneth Alimentobyn N Redith Drach, MD    THE PATIENT IS ENCOURAGED TO PRACTICE SOCIAL DISTANCING DUE TO THE COVID-19 PANDEMIC.

## 2018-09-23 LAB — T3, FREE: T3, Free: 2.6 pg/mL (ref 2.0–4.4)

## 2018-09-23 LAB — SPECIMEN STATUS REPORT

## 2018-09-23 LAB — PSA: Prostate Specific Ag, Serum: 0.3 ng/mL (ref 0.0–4.0)

## 2018-10-04 ENCOUNTER — Encounter: Payer: Self-pay | Admitting: Internal Medicine

## 2018-10-11 ENCOUNTER — Encounter: Payer: Self-pay | Admitting: Internal Medicine

## 2018-10-14 ENCOUNTER — Other Ambulatory Visit: Payer: BLUE CROSS/BLUE SHIELD

## 2018-10-14 ENCOUNTER — Other Ambulatory Visit: Payer: Self-pay

## 2018-10-22 LAB — HM DIABETES EYE EXAM

## 2018-11-21 ENCOUNTER — Encounter: Payer: Self-pay | Admitting: Internal Medicine

## 2018-11-23 ENCOUNTER — Encounter: Payer: Self-pay | Admitting: Internal Medicine

## 2018-12-02 ENCOUNTER — Telehealth: Payer: Self-pay

## 2018-12-02 NOTE — Telephone Encounter (Signed)
Left vm for pt to return call. We dont have the results of the [facility]. When spoke with them they were unable to run all test due to inadequate blood collection. They ran some of the test and we are awaiting those results and we will let him know when we get those.

## 2018-12-02 NOTE — Telephone Encounter (Signed)
The pt would like to know if he should return to the office for additional blood work because [employer] was unable to run some of the tests.  I told the pt that I would ask Dr. Baird Cancer when she returns to the office.  The pt said that there is no rush.

## 2018-12-14 ENCOUNTER — Telehealth: Payer: Self-pay

## 2018-12-14 NOTE — Telephone Encounter (Signed)
Called [facility] need to know what was wrong with the lab draw. And was his insurance billed.

## 2018-12-15 ENCOUNTER — Encounter: Payer: Self-pay | Admitting: Internal Medicine

## 2018-12-16 ENCOUNTER — Other Ambulatory Visit: Payer: Self-pay

## 2019-01-19 ENCOUNTER — Encounter: Payer: Self-pay | Admitting: Internal Medicine

## 2019-01-27 ENCOUNTER — Telehealth: Payer: Self-pay

## 2019-01-27 NOTE — Telephone Encounter (Signed)
I left the pt a message that I would need him to call me back so that I can find out if he can come another day besides next Thursday because the office will be closed.

## 2019-02-06 ENCOUNTER — Encounter: Payer: Self-pay | Admitting: Internal Medicine

## 2019-02-07 ENCOUNTER — Encounter: Payer: Self-pay | Admitting: Internal Medicine

## 2019-02-07 ENCOUNTER — Telehealth: Payer: Self-pay

## 2019-02-07 ENCOUNTER — Other Ambulatory Visit: Payer: Self-pay | Admitting: Internal Medicine

## 2019-02-07 DIAGNOSIS — E559 Vitamin D deficiency, unspecified: Secondary | ICD-10-CM

## 2019-02-07 DIAGNOSIS — Z79899 Other long term (current) drug therapy: Secondary | ICD-10-CM

## 2019-02-07 DIAGNOSIS — R946 Abnormal results of thyroid function studies: Secondary | ICD-10-CM

## 2019-02-07 DIAGNOSIS — E291 Testicular hypofunction: Secondary | ICD-10-CM

## 2019-02-07 NOTE — Telephone Encounter (Signed)
The patient was notified that the office contacted [facility] lab and was told that they don't recommend a lab draw for this Thursday because they want to make sure that the kits arrive in time for processing because they are closed on this Friday and the pt said that he will come on the 7th of January.

## 2019-02-07 NOTE — Telephone Encounter (Signed)
I left the pt a message that Dr. Baird Cancer said that the extra labs the pt requested to have done can be done through [facility] and does the pt want to have those drawn this Thursday.

## 2019-02-14 ENCOUNTER — Encounter: Payer: Self-pay | Admitting: Internal Medicine

## 2019-02-15 ENCOUNTER — Other Ambulatory Visit: Payer: Self-pay

## 2019-02-15 DIAGNOSIS — E8881 Metabolic syndrome: Secondary | ICD-10-CM

## 2019-02-17 ENCOUNTER — Other Ambulatory Visit: Payer: Self-pay | Admitting: Internal Medicine

## 2019-02-18 LAB — THYROID PANEL WITH TSH
Free Thyroxine Index: 2.1 (ref 1.2–4.9)
T3 Uptake Ratio: 28 % (ref 24–39)
T4, Total: 7.5 ug/dL (ref 4.5–12.0)
TSH: 3.29 u[IU]/mL (ref 0.450–4.500)

## 2019-02-18 LAB — CK: Total CK: 139 U/L (ref 41–331)

## 2019-02-18 LAB — HEMOGLOBIN A1C
Est. average glucose Bld gHb Est-mCnc: 117 mg/dL
Hgb A1c MFr Bld: 5.7 % — ABNORMAL HIGH (ref 4.8–5.6)

## 2019-02-18 LAB — VITAMIN D 25 HYDROXY (VIT D DEFICIENCY, FRACTURES): Vit D, 25-Hydroxy: 69 ng/mL (ref 30.0–100.0)

## 2019-02-18 LAB — URIC ACID: Uric Acid: 5.7 mg/dL (ref 3.8–8.4)

## 2019-02-24 ENCOUNTER — Encounter: Payer: Self-pay | Admitting: Internal Medicine

## 2019-02-25 LAB — TESTOSTERONE,FREE AND TOTAL
Testosterone, Free: 29.3 pg/mL — ABNORMAL HIGH (ref 6.6–18.1)
Testosterone: >1500 ng/dL — ABNORMAL HIGH (ref 264–916)

## 2019-02-25 LAB — SPECIMEN STATUS REPORT

## 2019-03-01 ENCOUNTER — Telehealth: Payer: Self-pay

## 2019-03-01 NOTE — Telephone Encounter (Signed)
The pt was given his most recent lab results.  The patient said that he was taking 26mL every day but that he stopped taking it a week ago when he saw his lab results and that he was only using the testosterone cream.  The pt said that he has been using a supplement called Ayur Guglipid 25mg  every night for his thyroid.

## 2019-03-10 ENCOUNTER — Encounter: Payer: Self-pay | Admitting: Internal Medicine

## 2019-03-23 ENCOUNTER — Encounter: Payer: Self-pay | Admitting: Internal Medicine

## 2019-03-27 ENCOUNTER — Encounter: Payer: Self-pay | Admitting: Internal Medicine

## 2019-04-14 ENCOUNTER — Other Ambulatory Visit: Payer: Self-pay

## 2019-04-14 ENCOUNTER — Ambulatory Visit (INDEPENDENT_AMBULATORY_CARE_PROVIDER_SITE_OTHER): Payer: BLUE CROSS/BLUE SHIELD | Admitting: Internal Medicine

## 2019-04-14 ENCOUNTER — Encounter: Payer: Self-pay | Admitting: Internal Medicine

## 2019-04-14 VITALS — BP 108/62 | HR 78 | Temp 97.9°F | Ht 71.6 in | Wt 174.6 lb

## 2019-04-14 DIAGNOSIS — Z0000 Encounter for general adult medical examination without abnormal findings: Secondary | ICD-10-CM

## 2019-04-14 DIAGNOSIS — Z1211 Encounter for screening for malignant neoplasm of colon: Secondary | ICD-10-CM | POA: Diagnosis not present

## 2019-04-14 DIAGNOSIS — Z8679 Personal history of other diseases of the circulatory system: Secondary | ICD-10-CM | POA: Diagnosis not present

## 2019-04-14 DIAGNOSIS — E7439 Other disorders of intestinal carbohydrate absorption: Secondary | ICD-10-CM | POA: Diagnosis not present

## 2019-04-14 LAB — POCT UA - MICROALBUMIN
Albumin/Creatinine Ratio, Urine, POC: <30
Creatinine, POC: 50 mg/dL
Microalbumin Ur, POC: 10 mg/L

## 2019-04-14 LAB — POCT URINALYSIS DIPSTICK
Bilirubin, UA: NEGATIVE
Blood, UA: NEGATIVE
Glucose, UA: NEGATIVE
Ketones, UA: NEGATIVE
Leukocytes, UA: NEGATIVE
Nitrite, UA: NEGATIVE
Protein, UA: NEGATIVE
Spec Grav, UA: 1.015 (ref 1.010–1.025)
Urobilinogen, UA: 0.2 E.U./dL
pH, UA: 5.5 (ref 5.0–8.0)

## 2019-04-14 MED ORDER — EZETIMIBE 10 MG PO TABS: 10.0000 mg | ORAL_TABLET | Freq: Every day | ORAL | 1 refills | Status: DC

## 2019-04-14 NOTE — Patient Instructions (Signed)
Nexlitol or Nexlizet  Health Maintenance, Male Adopting a healthy lifestyle and getting preventive care are important in promoting health and wellness. Ask your health care provider about:  The right schedule for you to have regular tests and exams.  Things you can do on your own to prevent diseases and keep yourself healthy. What should I know about diet, weight, and exercise? Eat a healthy diet   Eat a diet that includes plenty of vegetables, fruits, low-fat dairy products, and lean protein.  Do not eat a lot of foods that are high in solid fats, added sugars, or sodium. Maintain a healthy weight Body mass index (BMI) is a measurement that can be used to identify possible weight problems. It estimates body fat based on height and weight. Your health care provider can help determine your BMI and help you achieve or maintain a healthy weight. Get regular exercise Get regular exercise. This is one of the most important things you can do for your health. Most adults should:  Exercise for at least 150 minutes each week. The exercise should increase your heart rate and make you sweat (moderate-intensity exercise).  Do strengthening exercises at least twice a week. This is in addition to the moderate-intensity exercise.  Spend less time sitting. Even light physical activity can be beneficial. Watch cholesterol and blood lipids Have your blood tested for lipids and cholesterol at 61 years of age, then have this test every 5 years. You may need to have your cholesterol levels checked more often if:  Your lipid or cholesterol levels are high.  You are older than 61 years of age.  You are at high risk for heart disease. What should I know about cancer screening? Many types of cancers can be detected early and may often be prevented. Depending on your health history and family history, you may need to have cancer screening at various ages. This may include screening for:  Colorectal  cancer.  Prostate cancer.  Skin cancer.  Lung cancer. What should I know about heart disease, diabetes, and high blood pressure? Blood pressure and heart disease  High blood pressure causes heart disease and increases the risk of stroke. This is more likely to develop in people who have high blood pressure readings, are of African descent, or are overweight.  Talk with your health care provider about your target blood pressure readings.  Have your blood pressure checked: ? Every 3-5 years if you are 69-2 years of age. ? Every year if you are 45 years old or older.  If you are between the ages of 27 and 39 and are a current or former smoker, ask your health care provider if you should have a one-time screening for abdominal aortic aneurysm (AAA). Diabetes Have regular diabetes screenings. This checks your fasting blood sugar level. Have the screening done:  Once every three years after age 73 if you are at a normal weight and have a low risk for diabetes.  More often and at a younger age if you are overweight or have a high risk for diabetes. What should I know about preventing infection? Hepatitis B If you have a higher risk for hepatitis B, you should be screened for this virus. Talk with your health care provider to find out if you are at risk for hepatitis B infection. Hepatitis C Blood testing is recommended for:  Everyone born from [birth year] through 1967.  Anyone with known risk factors for hepatitis C. Sexually transmitted infections (STIs)  You should  be screened each year for STIs, including gonorrhea and chlamydia, if: ? You are sexually active and are younger than 61 years of age. ? You are older than 61 years of age and your health care provider tells you that you are at risk for this type of infection. ? Your sexual activity has changed since you were last screened, and you are at increased risk for chlamydia or gonorrhea. Ask your health care provider if you are at  risk.  Ask your health care provider about whether you are at high risk for HIV. Your health care provider may recommend a prescription medicine to help prevent HIV infection. If you choose to take medicine to prevent HIV, you should first get tested for HIV. You should then be tested every 3 months for as long as you are taking the medicine. Follow these instructions at home: Lifestyle  Do not use any products that contain nicotine or tobacco, such as cigarettes, e-cigarettes, and chewing tobacco. If you need help quitting, ask your health care provider.  Do not use street drugs.  Do not share needles.  Ask your health care provider for help if you need support or information about quitting drugs. Alcohol use  Do not drink alcohol if your health care provider tells you not to drink.  If you drink alcohol: ? Limit how much you have to 0-2 drinks a day. ? Be aware of how much alcohol is in your drink. In the [place] one drink equals one 12 oz bottle of beer (355 mL), one 5 oz glass of wine (148 mL), or one 1 oz glass of hard liquor (44 mL). General instructions  Schedule regular health, dental, and eye exams.  Stay current with your vaccines.  Tell your health care provider if: ? You often feel depressed. ? You have ever been abused or do not feel safe at home. Summary  Adopting a healthy lifestyle and getting preventive care are important in promoting health and wellness.  Follow your health care provider's instructions about healthy diet, exercising, and getting tested or screened for diseases.  Follow your health care provider's instructions on monitoring your cholesterol and blood pressure. This information is not intended to replace advice given to you by your health care provider. Make sure you discuss any questions you have with your health care provider. Document Revised: 01/20/2018 Document Reviewed: 01/20/2018 Elsevier Patient Education  2020 [employer].

## 2019-04-15 ENCOUNTER — Encounter: Payer: BLUE CROSS/BLUE SHIELD | Admitting: Internal Medicine

## 2019-04-18 ENCOUNTER — Other Ambulatory Visit: Payer: Self-pay

## 2019-04-18 ENCOUNTER — Encounter: Payer: Self-pay | Admitting: Internal Medicine

## 2019-04-18 MED ORDER — EZETIMIBE 10 MG PO TABS: 10.0000 mg | ORAL_TABLET | Freq: Every day | ORAL | 1 refills | Status: DC

## 2019-04-19 LAB — CBC
Hematocrit: 43.1 % (ref 37.5–51.0)
Hemoglobin: 13.7 g/dL (ref 13.0–17.7)
MCH: 25.3 pg — ABNORMAL LOW (ref 26.6–33.0)
MCHC: 31.8 g/dL (ref 31.5–35.7)
MCV: 80 fL (ref 79–97)
Platelets: 212 10*3/uL (ref 150–450)
RBC: 5.41 x10E6/uL (ref 4.14–5.80)
RDW: 13.2 % (ref 11.6–15.4)
WBC: 7.2 10*3/uL (ref 3.4–10.8)

## 2019-04-19 LAB — CMP14+EGFR
ALT: 24 IU/L (ref 0–44)
AST: 27 IU/L (ref 0–40)
Albumin/Globulin Ratio: 2.1 (ref 1.2–2.2)
Albumin: 4.7 g/dL (ref 3.8–4.9)
Alkaline Phosphatase: 46 IU/L (ref 39–117)
BUN/Creatinine Ratio: 17 (ref 10–24)
BUN: 16 mg/dL (ref 8–27)
Bilirubin Total: 0.5 mg/dL (ref 0.0–1.2)
CO2: 25 mmol/L (ref 20–29)
Calcium: 9.8 mg/dL (ref 8.6–10.2)
Chloride: 99 mmol/L (ref 96–106)
Creatinine, Ser: 0.93 mg/dL (ref 0.76–1.27)
GFR calc Af Amer: 103 mL/min/{1.73_m2} (ref >59)
GFR calc non Af Amer: 89 mL/min/{1.73_m2} (ref >59)
Globulin, Total: 2.2 g/dL (ref 1.5–4.5)
Glucose: 86 mg/dL (ref 65–99)
Potassium: 4.2 mmol/L (ref 3.5–5.2)
Sodium: 138 mmol/L (ref 134–144)
Total Protein: 6.9 g/dL (ref 6.0–8.5)

## 2019-04-19 LAB — INSULIN, RANDOM: INSULIN: 13.5 u[IU]/mL (ref 2.6–24.9)

## 2019-04-19 LAB — PSA, TOTAL AND FREE
PSA, Free Pct: 22.5 %
PSA, Free: 0.09 ng/mL
Prostate Specific Ag, Serum: 0.4 ng/mL (ref 0.0–4.0)

## 2019-04-19 LAB — TESTOSTERONE,FREE AND TOTAL
Testosterone, Free: 8.8 pg/mL (ref 6.6–18.1)
Testosterone: 542 ng/dL (ref 264–916)

## 2019-04-28 ENCOUNTER — Encounter: Payer: Self-pay | Admitting: Internal Medicine

## 2019-04-29 ENCOUNTER — Encounter: Payer: Self-pay | Admitting: Internal Medicine

## 2019-05-11 NOTE — Progress Notes (Signed)
This visit occurred during the SARS-CoV-2 public health emergency.  Safety protocols were in place, including screening questions prior to the visit, additional usage of staff PPE, and extensive cleaning of exam room while observing appropriate contact time as indicated for disinfecting solutions.  Subjective:     Patient ID: Devon Fowler , male    DOB: Sep 17, 1960 , 59 y.o.   MRN: [id number]   Chief Complaint  Patient presents with  . Annual Exam    HPI  He presents today for a full physical examination. He has no specific concerns or complaints at this time.     Past Medical History:  Diagnosis Date  . High cholesterol      Family History  Problem Relation Age of Onset  . Diabetes Mother   . Heart attack Father      Current Outpatient Medications:  .  aspirin (ASPIR-LOW) 81 MG EC tablet, Take 81 mg by mouth daily. Swallow whole., Disp: , Rfl:  .  Barberry-Oreg Grape-Goldenseal (BERBERINE COMPLEX PO), Take 500 mg by mouth. 1 2 times per day, Disp: , Rfl:  .  Cholecalciferol (VITAMIN D-3) 5000 UNIT/ML LIQD, Place under the tongue. 2 times per week, Disp: , Rfl:  .  Coenzyme Q10 (CO Q-10) 200 MG CAPS, Take 1 capsule by mouth 2 (two) times daily., Disp: , Rfl:  .  Continuous Blood Gluc Sensor (FREESTYLE LIBRE 14 DAY SENSOR) MISC, USE TO CHECK BLOOD SUGAR FOUR TIMES A DAY, Disp: 3 each, Rfl: 3 .  Dorzolamide HCl-Timolol Mal PF 22.3-6.8 MG/ML SOLN, Apply 1 drop to eye 2 (two) times daily., Disp: , Rfl:  .  finasteride (PROSCAR) 5 MG tablet, Take 2.5 mg by mouth daily., Disp: , Rfl:  .  latanoprost (XALATAN) 0.005 % ophthalmic solution, Place 1 drop into both eyes at bedtime., Disp: , Rfl:  .  Menaquinone-7 (VITAMIN K2 PO), Take by mouth every morning., Disp: , Rfl:  .  metFORMIN (GLUCOPHAGE-XR) 500 MG 24 hr tablet, TAKE ONE TABLET BY MOUTH 2 TIMES PER DAY, Disp: 180 tablet, Rfl: 2 .  Multiple Vitamin (MULTIVITAMIN) tablet, Take 1 tablet by mouth daily., Disp: , Rfl:  .  omega-3  acid ethyl esters (LOVAZA) 1 g capsule, Take 1 g by mouth daily., Disp: , Rfl:  .  Resveratrol 250 MG CAPS, Take 1 capsule by mouth at bedtime., Disp: , Rfl:  .  rosuvastatin (CRESTOR) 40 MG tablet, Take 40 mg by mouth daily., Disp: , Rfl:  .  TESTOSTERONE NA, Apply topically. compound once - 2 times per week, Disp: , Rfl:  .  vitamin E (VITAMIN E) 400 UNIT capsule, Take 400 Units by mouth every morning., Disp: , Rfl:  .  ezetimibe (ZETIA) 10 MG tablet, Take 1 tablet (10 mg total) by mouth daily., Disp: 90 tablet, Rfl: 1 .  milk thistle 175 MG tablet, Take 175 mg by mouth daily., Disp: , Rfl:    Allergies  Allergen Reactions  . Lamisil [Terbinafine] Hives    Men's preventive visit. Patient Health Questionnaire (PHQ-2) is    Office Visit from 04/14/2019 in [facility]  PHQ-2 Total Score  0    . Patient is on a clean diet. Marital status: Married. Relevant history for alcohol use is:  Social History   Substance and Sexual Activity  Alcohol Use No  . Relevant history for tobacco use is:  Social History   Tobacco Use  Smoking Status Never Smoker  Smokeless Tobacco Never Used  .  Review of Systems  Constitutional: Negative.   HENT: Negative.   Eyes: Negative.   Respiratory: Negative.   Cardiovascular: Negative.   Endocrine: Negative.   Genitourinary: Negative.   Musculoskeletal: Negative.   Skin: Negative.   Allergic/Immunologic: Negative.   Neurological: Negative.   Hematological: Negative.   Psychiatric/Behavioral: Negative.      Today's Vitals   04/14/19 1507  BP: 108/62  Pulse: 78  Temp: 97.9 F (36.6 C)  TempSrc: Oral  Weight: 174 lb 9.6 oz (79.2 kg)  Height: 5' 11.6" (1.819 m)   Body mass index is 23.95 kg/m.   Objective:  Physical Exam Vitals and nursing note reviewed.  Constitutional:      Appearance: Normal appearance.  HENT:     Head: Normocephalic and atraumatic.     Right Ear: Tympanic membrane, ear canal and external ear  normal.     Left Ear: Tympanic membrane, ear canal and external ear normal.     Nose:     Comments: Deferred, masked    Mouth/Throat:     Comments: Deferred, masked Eyes:     Extraocular Movements: Extraocular movements intact.     Conjunctiva/sclera: Conjunctivae normal.     Pupils: Pupils are equal, round, and reactive to light.  Cardiovascular:     Rate and Rhythm: Normal rate and regular rhythm.     Pulses: Normal pulses.     Heart sounds: Normal heart sounds.  Pulmonary:     Effort: Pulmonary effort is normal.     Breath sounds: Normal breath sounds.  Chest:     Breasts:        Right: Normal. No swelling, bleeding, inverted nipple, mass or nipple discharge.        Left: Normal. No swelling, bleeding, inverted nipple, mass or nipple discharge.  Abdominal:     General: Abdomen is flat. Bowel sounds are normal.     Palpations: Abdomen is soft.  Genitourinary:    Prostate: Normal.     Rectum: Normal. Guaiac result negative.  Musculoskeletal:        General: Normal range of motion.     Cervical back: Normal range of motion and neck supple.  Skin:    General: Skin is warm.  Neurological:     General: No focal deficit present.     Mental Status: He is alert.  Psychiatric:        Mood and Affect: Mood normal.        Behavior: Behavior normal.         Assessment And Plan:     1. Routine general medical examination at health care facility  A full exam was performed.  DRE performed, stool heme negative.  PATIENT IS ADVISED TO GET 30-45 MINUTES REGULAR EXERCISE NO LESS THAN FOUR TO FIVE DAYS PER WEEK - BOTH WEIGHTBEARING EXERCISES AND AEROBIC ARE RECOMMENDED.  HE IS ADVISED TO FOLLOW A HEALTHY DIET WITH AT LEAST SIX FRUITS/VEGGIES PER DAY, DECREASE INTAKE OF RED MEAT, AND TO INCREASE FISH INTAKE TO TWO DAYS PER WEEK.  MEATS/FISH SHOULD NOT BE FRIED, BAKED OR BROILED IS PREFERABLE.  I SUGGEST WEARING SPF 50 SUNSCREEN ON EXPOSED PARTS AND ESPECIALLY WHEN IN THE DIRECT SUNLIGHT  FOR AN EXTENDED PERIOD OF TIME.  PLEASE AVOID FAST FOOD RESTAURANTS AND INCREASE YOUR WATER INTAKE.  - PSA, total and free - Insulin, random(561) - Testosterone,Free and Total - CBC no Diff - CMP14+EGFR  2. Glucose intolerance  I will also check insulin level today. He was also  given copy of [employer] booklet of results.   - POCT Urinalysis Dipstick (81002) - POCT UA - Microalbumin - EKG 12-Lead  3. Personal history of coronary atherosclerosis  [facility]iology note reviewed in full detail. I will refer him for Coronary calcium scoring as requested.   - CT CARDIAC SCORING; Future    Maximino Greenland, MD    THE PATIENT IS ENCOURAGED TO PRACTICE SOCIAL DISTANCING DUE TO THE COVID-19 PANDEMIC.

## 2019-05-19 ENCOUNTER — Encounter: Payer: Self-pay | Admitting: Internal Medicine

## 2019-07-21 ENCOUNTER — Encounter: Payer: Self-pay | Admitting: Internal Medicine

## 2019-07-21 ENCOUNTER — Other Ambulatory Visit: Payer: Self-pay | Admitting: Internal Medicine

## 2019-08-01 ENCOUNTER — Encounter: Payer: Self-pay | Admitting: Internal Medicine

## 2019-08-25 ENCOUNTER — Encounter: Payer: Self-pay | Admitting: Internal Medicine

## 2019-08-25 DIAGNOSIS — H401112 Primary open-angle glaucoma, right eye, moderate stage: Secondary | ICD-10-CM | POA: Diagnosis not present

## 2019-08-25 DIAGNOSIS — H5213 Myopia, bilateral: Secondary | ICD-10-CM | POA: Diagnosis not present

## 2019-08-25 DIAGNOSIS — H2513 Age-related nuclear cataract, bilateral: Secondary | ICD-10-CM | POA: Diagnosis not present

## 2019-08-25 DIAGNOSIS — H401121 Primary open-angle glaucoma, left eye, mild stage: Secondary | ICD-10-CM | POA: Diagnosis not present

## 2019-08-25 DIAGNOSIS — E119 Type 2 diabetes mellitus without complications: Secondary | ICD-10-CM | POA: Diagnosis not present

## 2019-08-25 LAB — HM DIABETES EYE EXAM

## 2019-08-29 DIAGNOSIS — M25551 Pain in right hip: Secondary | ICD-10-CM | POA: Diagnosis not present

## 2019-09-08 DIAGNOSIS — E7849 Other hyperlipidemia: Secondary | ICD-10-CM | POA: Diagnosis not present

## 2019-09-08 DIAGNOSIS — Z8249 Family history of ischemic heart disease and other diseases of the circulatory system: Secondary | ICD-10-CM | POA: Diagnosis not present

## 2019-09-08 DIAGNOSIS — I2510 Atherosclerotic heart disease of native coronary artery without angina pectoris: Secondary | ICD-10-CM | POA: Diagnosis not present

## 2019-09-15 ENCOUNTER — Encounter: Payer: Self-pay | Admitting: Internal Medicine

## 2019-09-15 DIAGNOSIS — M1611 Unilateral primary osteoarthritis, right hip: Secondary | ICD-10-CM | POA: Diagnosis not present

## 2019-09-22 DIAGNOSIS — M25551 Pain in right hip: Secondary | ICD-10-CM | POA: Diagnosis not present

## 2019-11-03 DIAGNOSIS — M25551 Pain in right hip: Secondary | ICD-10-CM | POA: Diagnosis not present

## 2019-11-14 ENCOUNTER — Other Ambulatory Visit: Payer: Self-pay | Admitting: Internal Medicine

## 2019-11-16 DIAGNOSIS — Z23 Encounter for immunization: Secondary | ICD-10-CM | POA: Diagnosis not present

## 2019-12-30 ENCOUNTER — Other Ambulatory Visit: Payer: Self-pay | Admitting: Internal Medicine

## 2020-01-04 ENCOUNTER — Other Ambulatory Visit: Payer: Self-pay | Admitting: Internal Medicine

## 2020-01-19 DIAGNOSIS — M25551 Pain in right hip: Secondary | ICD-10-CM | POA: Diagnosis not present

## 2020-02-27 ENCOUNTER — Encounter: Payer: Self-pay | Admitting: Internal Medicine

## 2020-03-08 ENCOUNTER — Other Ambulatory Visit: Payer: Self-pay

## 2020-03-08 ENCOUNTER — Other Ambulatory Visit: Payer: 59

## 2020-03-08 DIAGNOSIS — E291 Testicular hypofunction: Secondary | ICD-10-CM

## 2020-03-08 DIAGNOSIS — R946 Abnormal results of thyroid function studies: Secondary | ICD-10-CM

## 2020-03-08 DIAGNOSIS — E7439 Other disorders of intestinal carbohydrate absorption: Secondary | ICD-10-CM

## 2020-03-08 DIAGNOSIS — Z8679 Personal history of other diseases of the circulatory system: Secondary | ICD-10-CM

## 2020-03-08 DIAGNOSIS — E7800 Pure hypercholesterolemia, unspecified: Secondary | ICD-10-CM

## 2020-03-08 DIAGNOSIS — E8881 Metabolic syndrome: Secondary | ICD-10-CM

## 2020-03-08 DIAGNOSIS — E559 Vitamin D deficiency, unspecified: Secondary | ICD-10-CM

## 2020-03-08 NOTE — Addendum Note (Signed)
Addended by: Mariam Dollar on: 03/08/2020 12:59 PM   Modules accepted: Orders

## 2020-03-16 LAB — CBC
Hematocrit: 43.5 % (ref 37.5–51.0)
Hemoglobin: 13.7 g/dL (ref 13.0–17.7)
MCH: 24.8 pg — ABNORMAL LOW (ref 26.6–33.0)
MCHC: 31.5 g/dL (ref 31.5–35.7)
MCV: 79 fL (ref 79–97)
Platelets: 223 10*3/uL (ref 150–450)
RBC: 5.52 x10E6/uL (ref 4.14–5.80)
RDW: 13.3 % (ref 11.6–15.4)
WBC: 4.9 10*3/uL (ref 3.4–10.8)

## 2020-03-16 LAB — CMP14+EGFR
ALT: 22 IU/L (ref 0–44)
AST: 29 IU/L (ref 0–40)
Albumin/Globulin Ratio: 1.8 (ref 1.2–2.2)
Albumin: 4.8 g/dL (ref 3.8–4.8)
Alkaline Phosphatase: 43 IU/L — ABNORMAL LOW (ref 44–121)
BUN/Creatinine Ratio: 17 (ref 10–24)
BUN: 17 mg/dL (ref 8–27)
Bilirubin Total: 0.4 mg/dL (ref 0.0–1.2)
CO2: 27 mmol/L (ref 20–29)
Calcium: 9.6 mg/dL (ref 8.6–10.2)
Chloride: 101 mmol/L (ref 96–106)
Creatinine, Ser: 0.98 mg/dL (ref 0.76–1.27)
GFR calc Af Amer: 96 mL/min/{1.73_m2} (ref >59)
GFR calc non Af Amer: 83 mL/min/{1.73_m2} (ref >59)
Globulin, Total: 2.6 g/dL (ref 1.5–4.5)
Glucose: 97 mg/dL (ref 65–99)
Potassium: 4.6 mmol/L (ref 3.5–5.2)
Sodium: 140 mmol/L (ref 134–144)
Total Protein: 7.4 g/dL (ref 6.0–8.5)

## 2020-03-16 LAB — T3, FREE: T3, Free: 2.8 pg/mL (ref 2.0–4.4)

## 2020-03-16 LAB — MAGNESIUM, RBC: Magnesium RBC: 5 mg/dL (ref 4.2–6.8)

## 2020-03-16 LAB — FOLATE RBC
Folate, Hemolysate: 552 ng/mL
Folate, RBC: 1269 ng/mL (ref >498)

## 2020-03-16 LAB — VITAMIN E
Vitamin E (Alpha Tocopherol): 15.1 mg/L (ref 9.0–29.0)
Vitamin E(Gamma Tocopherol): 0.6 mg/L (ref 0.5–4.9)

## 2020-03-16 LAB — HIGH SENSITIVITY CRP: CRP, High Sensitivity: 0.41 mg/L (ref 0.00–3.00)

## 2020-03-16 LAB — FERRITIN: Ferritin: 170 ng/mL (ref 30–400)

## 2020-03-16 LAB — VITAMIN A: Vitamin A: 83.1 ug/dL — ABNORMAL HIGH (ref 22.0–69.5)

## 2020-03-16 LAB — HEMOGLOBIN A1C
Est. average glucose Bld gHb Est-mCnc: 123 mg/dL
Hgb A1c MFr Bld: 5.9 % — ABNORMAL HIGH (ref 4.8–5.6)

## 2020-03-16 LAB — T4: T4, Total: 8.4 ug/dL (ref 4.5–12.0)

## 2020-03-16 LAB — DHEA-SULFATE: DHEA-SO4: 86.1 ug/dL (ref 48.9–344.2)

## 2020-03-16 LAB — LIPID CASCADE
Cholesterol, Total: 229 mg/dL — ABNORMAL HIGH (ref 100–199)
HDL: 42 mg/dL (ref >39)
LDL Chol Calc (NIH): 168 mg/dL — ABNORMAL HIGH (ref 0–99)
LDL/HDL Ratio: 4 ratio — ABNORMAL HIGH (ref 0.0–3.6)
Total Non-HDL-Chol (LDL+VLDL): 187 mg/dL — ABNORMAL HIGH (ref 0–129)
Triglycerides: 105 mg/dL (ref 0–149)

## 2020-03-16 LAB — HOMOCYSTEINE: Homocysteine: 12.1 umol/L (ref 0.0–17.2)

## 2020-03-16 LAB — OXIDIZED LDL: Oxidized LDL: 58 ng/mL (ref 10–170)

## 2020-03-16 LAB — VITAMIN D 25 HYDROXY (VIT D DEFICIENCY, FRACTURES): Vit D, 25-Hydroxy: 57 ng/mL (ref 30.0–100.0)

## 2020-03-16 LAB — T3, REVERSE: Reverse T3, Serum: 18.1 ng/dL (ref 9.2–24.1)

## 2020-03-16 LAB — TESTOSTERONE: Testosterone: 898 ng/dL (ref 264–916)

## 2020-03-19 ENCOUNTER — Encounter: Payer: Self-pay | Admitting: Internal Medicine

## 2020-03-22 ENCOUNTER — Ambulatory Visit: Payer: 59 | Admitting: Nurse Practitioner

## 2020-03-28 ENCOUNTER — Ambulatory Visit: Payer: 59 | Admitting: Nurse Practitioner

## 2020-03-30 ENCOUNTER — Encounter: Payer: Self-pay | Admitting: Internal Medicine

## 2020-04-02 ENCOUNTER — Other Ambulatory Visit: Payer: Self-pay | Admitting: Internal Medicine

## 2020-04-05 ENCOUNTER — Other Ambulatory Visit: Payer: Self-pay

## 2020-04-05 ENCOUNTER — Ambulatory Visit: Payer: 59 | Admitting: Nurse Practitioner

## 2020-04-05 VITALS — BP 114/60 | HR 49 | Temp 97.5°F | Ht 72.4 in | Wt 173.2 lb

## 2020-04-05 DIAGNOSIS — Z125 Encounter for screening for malignant neoplasm of prostate: Secondary | ICD-10-CM | POA: Diagnosis not present

## 2020-04-05 DIAGNOSIS — Z01818 Encounter for other preprocedural examination: Secondary | ICD-10-CM

## 2020-04-05 DIAGNOSIS — R9431 Abnormal electrocardiogram [ECG] [EKG]: Secondary | ICD-10-CM | POA: Diagnosis not present

## 2020-04-05 DIAGNOSIS — R7303 Prediabetes: Secondary | ICD-10-CM | POA: Diagnosis not present

## 2020-04-05 MED ORDER — METFORMIN HCL 850 MG PO TABS: 850.0000 mg | ORAL_TABLET | Freq: Two times a day (BID) | ORAL | 3 refills | Status: DC

## 2020-04-05 NOTE — Progress Notes (Signed)
I,Tianna Badgett,acting as a Neurosurgeon for [employer], NP.,have documented all relevant documentation on the behalf of [employer], NP,as directed by  Charlesetta Ivory, NP while in the presence of Charlesetta Ivory, NP.  This visit occurred during the SARS-CoV-2 public health emergency.  Safety protocols were in place, including screening questions prior to the visit, additional usage of staff PPE, and extensive cleaning of exam room while observing appropriate contact time as indicated for disinfecting solutions.  Subjective:     Patient ID: Devon Fowler , male    DOB: 07-31-1960 , 60 y.o.   MRN: [id number]   Chief Complaint  Patient presents with  . Pre-op Exam    HPI  Patient is here for Pre-op evaluation. He is having right hip surgery on April 6,2022. Today we will do the remaining tests that he needs to do for approval for surgery. Upon recieiving the results we will fax the results to his surgeon. Lab results were also reviewed with him. EKG image, PT and INR will be ordered during this visit.     Past Medical History:  Diagnosis Date  . High cholesterol      Family History  Problem Relation Age of Onset  . Diabetes Mother   . Heart attack Father      Current Outpatient Medications:  .  aspirin 81 MG EC tablet, Take 81 mg by mouth daily. Swallow whole., Disp: , Rfl:  .  Cholecalciferol (VITAMIN D-3) 5000 UNIT/ML LIQD, Place under the tongue. 2 times per week, Disp: , Rfl:  .  Coenzyme Q10 (CO Q-10) 200 MG CAPS, Take 1 capsule by mouth 2 (two) times daily., Disp: , Rfl:  .  Continuous Blood Gluc Sensor (FREESTYLE LIBRE 14 DAY SENSOR) MISC, USE TO CHECK BLOOD SUGAR FOUR TIMES A DAY AS DIRECTED, Disp: 6 each, Rfl: 2 .  Dorzolamide HCl-Timolol Mal PF 22.3-6.8 MG/ML SOLN, Apply 1 drop to eye 2 (two) times daily., Disp: , Rfl:  .  ezetimibe (ZETIA) 10 MG tablet, TAKE ONE TABLET BY MOUTH DAILY, Disp: 90 tablet, Rfl: 0 .  finasteride (PROSCAR) 5 MG tablet, Take 2.5 mg  by mouth daily., Disp: , Rfl:  .  latanoprost (XALATAN) 0.005 % ophthalmic solution, Place 1 drop into both eyes at bedtime., Disp: , Rfl:  .  Menaquinone-7 (VITAMIN K2 PO), Take by mouth every morning., Disp: , Rfl:  .  metFORMIN (GLUCOPHAGE) 850 MG tablet, Take 1 tablet (850 mg total) by mouth 2 (two) times daily with a meal., Disp: 180 tablet, Rfl: 3 .  MINOXIDIL PO, Take by mouth., Disp: , Rfl:  .  Multiple Vitamin (MULTIVITAMIN) tablet, Take 1 tablet by mouth daily., Disp: , Rfl:  .  omega-3 acid ethyl esters (LOVAZA) 1 g capsule, Take 1 g by mouth daily., Disp: , Rfl:  .  Resveratrol 250 MG CAPS, Take 1 capsule by mouth at bedtime., Disp: , Rfl:  .  rosuvastatin (CRESTOR) 40 MG tablet, Take 40 mg by mouth daily., Disp: , Rfl:  .  TESTOSTERONE NA, Apply topically. compound once - 2 times per week, Disp: , Rfl:  .  vitamin E 180 MG (400 UNITS) capsule, Take 400 Units by mouth every morning., Disp: , Rfl:    Allergies  Allergen Reactions  . Lamisil [Terbinafine] Hives     Review of Systems  Constitutional: Negative.  Negative for chills, fatigue and fever.  Respiratory: Negative.  Negative for cough, chest tightness, shortness of breath and wheezing.   Cardiovascular: Negative.  Negative for chest pain and palpitations.  Gastrointestinal: Negative.  Negative for constipation and nausea.  Endocrine: Negative for cold intolerance and heat intolerance.  Musculoskeletal:       Hip pain  Neurological: Negative.  Negative for headaches.  All other systems reviewed and are negative.    Today's Vitals   04/05/20 0849  BP: 114/60  Pulse: (!) 49  Temp: (!) 97.5 F (36.4 C)  TempSrc: Oral  Weight: 173 lb 3.2 oz (78.6 kg)  Height: 6' 0.4" (1.839 m)   Body mass index is 23.23 kg/m.  Wt Readings from Last 3 Encounters:  04/05/20 173 lb 3.2 oz (78.6 kg)  04/14/19 174 lb 9.6 oz (79.2 kg)  09/16/18 174 lb 6.4 oz (79.1 kg)    Objective:  Physical Exam Constitutional:      General:  He is not in acute distress.    Appearance: Normal appearance. He is normal weight.  HENT:     Head: Normocephalic and atraumatic.  Cardiovascular:     Rate and Rhythm: Normal rate and regular rhythm.     Pulses: Normal pulses.     Heart sounds: Normal heart sounds.  Pulmonary:     Effort: Pulmonary effort is normal. No respiratory distress.     Breath sounds: Normal breath sounds. No wheezing.  Abdominal:     General: Bowel sounds are normal.  Skin:    General: Skin is warm and dry.     Capillary Refill: Capillary refill takes less than 2 seconds.  Neurological:     Mental Status: He is alert and oriented to person, place, and time. Mental status is at baseline.  Psychiatric:        Mood and Affect: Mood normal.        Behavior: Behavior normal.        Thought Content: Thought content normal.        Judgment: Judgment normal.         Assessment And Plan:     1. Pre-op evaluation - EKG 12-Lead - APTT - Protime-INR -Labs from 03/08/20 were reviewed with the patient.  -Will wait on PT, INR and clearance from cardiology before patient is cleared from surgery.    2. Abnormal EKG -EKG today showed sinus bradycardia with first degree AV block  -Ambulatory referral to Cardiology  3. Prediabetes -Reviewed Hmg A1c results with patient which is  5.9 -will increase metformin 500 mg twice daily to 850 twice daily with meals.  - metFORMIN (GLUCOPHAGE) 850 MG tablet; Take 1 tablet (850 mg total) by mouth 2 (two) times daily with a meal.  Dispense: 180 tablet; Refill: 3  4. Screening PSA (prostate specific antigen) - PSA   Pending results for INR, PTT and cardiology notes for approval for surgery.   Patient was given opportunity to ask questions. Patient verbalized understanding of the plan and was able to -repeat key elements of the plan. All questions were answered to their satisfaction.  Charlesetta Ivory, NP   I, Charlesetta Ivory, NP, have reviewed all documentation for  this visit. The documentation on 04/05/20 for the exam, diagnosis, procedures, and orders are all accurate and complete.  THE PATIENT IS ENCOURAGED TO PRACTICE SOCIAL DISTANCING DUE TO THE COVID-19 PANDEMIC.

## 2020-04-05 NOTE — Patient Instructions (Signed)
Preparing for Lower Extremity Surgery, Adult Lower extremity surgery may be needed if you have a hip, knee, ankle, or foot problem, especially if other treatments have not helped. It is important to prepare for your procedure physically and mentally. With any surgery, there are potential risks and complications. There is also a recovery period. Knowing what to expect and making arrangements ahead of time can lessen your risk of complications and make recovery go more smoothly. Tell a health care provider about:  Any allergies you have.  All medicines you are taking, including vitamins, herbs, eye drops, creams, and over-the-counter medicines.  Any problems you or family members have had with anesthetic medicines.  Any blood disorders you have.  Any surgeries you have had.  Any medical conditions you have.  Whether you are pregnant or may be pregnant. What happens before the procedure? Tests Your surgeon may do routine tests to make sure it is safe for you to have surgery. These may include:  Blood tests.  A chest X-ray.  An electrocardiogram (ECG). Doctor visits  If you have any medical problems, you may need to see your primary health care provider.  If you have any heart problems, you may need to see a cardiologist. Medicine review Ask your health care provider about:  Changing or stopping your regular medicines. This is especially important if you are taking diabetes medicines or blood thinners.  Taking medicines such as aspirin and ibuprofen. These medicines can thin your blood. Do not take these medicines unless your health care provider tells you to take them.  Taking over-the-counter medicines, vitamins, herbs, and supplements.   Quitting smoking If you smoke, quit as soon as you can before surgery. If there is time, it is best to quit several months before surgery. Tell your surgeon if you use any products that contain nicotine or tobacco, such as cigarettes,  e-cigarettes, and chewing tobacco. These can delay healing. If you need help quitting, ask your health care provider.   Blood transfusion preparation Talk to your surgeon about the possibility of needing a blood transfusion during your surgery. To prepare:  You may be able to donate your own blood instead of using donated blood from a blood bank.  Your surgeon may have you take iron supplements to build up your blood.  You will have to sign a blood donation consent form. Anesthesia options Ask your surgeon about your anesthesia options for lower extremity surgery. These may include being put to sleep (general anesthesia), having an injection into your spine that will make you numb below the injection (spinal anesthesia), or having an injection to block pain in the area of the surgery (regional anesthesia). Talk to your surgeon about the risks and benefits of each. You may also be able to meet with an anesthesia provider before surgery.  If you have spinal or regional anesthesia, you will also get medicine through your IV to help you relax (IV sedation).  If you have general anesthesia, you will have to follow instructions from your health care provider about eating and drinking, which may include: ? 8 hours before the procedure - stop eating heavy meals or foods, such as meat, fried foods, or fatty foods. ? 6 hours before the procedure - stop eating light meals or foods, such as toast or cereal. ? 6 hours before the procedure - stop drinking milk or drinks that contain milk. ? 2 hours before the procedure - stop drinking clear liquids.   Recovery planning  Plan  to have someone take you home from the hospital.  Plan to have extra help at home. You may have restrictions on your activities after your surgery.  You may need to use a walker, crutches, or a cane to help you walk after your surgery. Talk with your health care provider to see if this is something you will need. If you will be using  crutches or a walker, practice using them around the house before surgery.  You may not be able to drive for a while. Plan to have someone drive you on errands and to appointments.  Perform exercises to strengthen the muscles that support your lower limb as told by your health care provider. Being at a healthy weight means less weight on your lower extremity and a faster recovery.  Get your home ready for recovery: ? Make and freeze meals ahead of time. ? Remove clutter from your floors to avoid falls. ? Place the items you will need within easy reach. ? Have night-lights in your bedroom and hallway. Consent form preparation Before signing the consent form:  Discuss all the risks and benefits of the procedure with your surgeon.  Talk about what to expect during recovery.  Ask about how your pain will be managed.  Make sure you understand all possible complications and what to watch out for. Summary  Good preparation before surgery can lessen your risks and complications and make recovery go more smoothly.  If you smoke, quit as soon as you can before surgery.  Start planning for recovery at home before surgery. Get your home ready so it is a safe and clutter-free place to recover.  Make sure you understand all possible complications and what to watch out for. This information is not intended to replace advice given to you by your health care provider. Make sure you discuss any questions you have with your health care provider. Document Revised: 11/25/2018 Document Reviewed: 11/25/2018 Elsevier Patient Education  2021 [employer].

## 2020-04-06 LAB — PSA: Prostate Specific Ag, Serum: 0.4 ng/mL (ref 0.0–4.0)

## 2020-04-06 LAB — PROTIME-INR
INR: 1 (ref 0.9–1.2)
Prothrombin Time: 10.9 s (ref 9.1–12.0)

## 2020-04-06 LAB — APTT: aPTT: 26 s (ref 24–33)

## 2020-04-12 ENCOUNTER — Ambulatory Visit: Payer: 59 | Admitting: Nurse Practitioner

## 2020-04-16 ENCOUNTER — Telehealth: Payer: Self-pay

## 2020-04-16 NOTE — Telephone Encounter (Signed)
-----   Message from Charlesetta Ivory, NP sent at 04/06/2020  1:37 PM EST ----- Your PTT, INR and PSA are all within normal range. Just waiting for you to see a cardiologist to be cleared. After you see them, I can fax your results to your surgeon.

## 2020-04-17 ENCOUNTER — Encounter: Payer: BLUE CROSS/BLUE SHIELD | Admitting: Internal Medicine

## 2020-05-08 NOTE — Progress Notes (Signed)
DUE TO COVID-19 ONLY ONE VISITOR IS ALLOWED TO COME WITH YOU AND STAY IN THE WAITING ROOM ONLY DURING PRE OP AND PROCEDURE DAY OF SURGERY. THE 1 VISITOR  MAY VISIT WITH YOU AFTER SURGERY IN YOUR PRIVATE ROOM DURING VISITING HOURS ONLY!  YOU NEED TO HAVE A COVID 19 TEST ON__4/05/2020 _____ @_______ , THIS TEST MUST BE DONE BEFORE SURGERY,  COVID TESTING SITE [address] [place] , IT IS ON THE RIGHT GOING OUT [address] APPROXIMATELY  2 MINUTES PAST [employer] ON THE RIGHT. ONCE YOUR COVID TEST IS COMPLETED,  PLEASE BEGIN THE QUARANTINE INSTRUCTIONS AS OUTLINED IN YOUR HANDOUT.                Devon Fowler  05/08/2020   Your procedure is scheduled on:  05/16/20  Report to [facility] Main  Entrance   Report to admitting at    0600 AM     Call this number if you have problems the morning of surgery [phone]    REMEMBER: NO  SOLID FOOD CANDY OR GUM AFTER MIDNIGHT. CLEAR LIQUIDS UNTIL     0530AM     . NOTHING BY MOUTH EXCEPT CLEAR LIQUIDS UNTIL       0530AM   . PLEASE FINISH ENSURE DRINK PER SURGEON ORDER  WHICH NEEDS TO BE COMPLETED AT  0530AM     .      CLEAR LIQUID DIET   Foods Allowed                                                                    Coffee and tea, regular and decaf                            Fruit ices (not with fruit pulp)                                      Iced Popsicles                                    Carbonated beverages, regular and diet                                    Cranberry, grape and apple juices Sports drinks like Gatorade Lightly seasoned clear broth or consume(fat free) Sugar, honey syrup ___________________________________________________________________      BRUSH YOUR TEETH MORNING OF SURGERY AND RINSE YOUR MOUTH OUT, NO CHEWING GUM CANDY OR MINTS.     Take these medicines the morning of surgery with A SIP OF WATER:  PROSCAR, EYE DROPS AS USUAL  DO NOT TAKE ANY DIABETIC MEDICATIONS DAY OF  YOUR SURGERY                               You may not have any metal on your body including hair pins and  piercings  Do not wear jewelry, make-up, lotions, powders or perfumes, deodorant             Do not wear nail polish on your fingernails.  Do not shave  48 hours prior to surgery.              Men may shave face and neck.   Do not bring valuables to the hospital. [place].  Contacts, dentures or bridgework may not be worn into surgery.  Leave suitcase in the car. After surgery it may be brought to your room.     Patients discharged the day of surgery will not be allowed to drive home. IF YOU ARE HAVING SURGERY AND GOING HOME THE SAME DAY, YOU MUST HAVE AN ADULT TO DRIVE YOU HOME AND BE WITH YOU FOR 24 HOURS. YOU MAY GO HOME BY TAXI OR UBER OR ORTHERWISE, BUT AN ADULT MUST ACCOMPANY YOU HOME AND STAY WITH YOU FOR 24 HOURS.  Name and phone number of your driver:  Special Instructions: N/A              Please read over the following fact sheets you were given: _____________________________________________________________________  [facility] - Preparing for Surgery Before surgery, you can play an important role.  Because skin is not sterile, your skin needs to be as free of germs as possible.  You can reduce the number of germs on your skin by washing with CHG (chlorahexidine gluconate) soap before surgery.  CHG is an antiseptic cleaner which kills germs and bonds with the skin to continue killing germs even after washing. Please DO NOT use if you have an allergy to CHG or antibacterial soaps.  If your skin becomes reddened/irritated stop using the CHG and inform your nurse when you arrive at Short Stay. Do not shave (including legs and underarms) for at least 48 hours prior to the first CHG shower.  You may shave your face/neck. Please follow these instructions carefully:  1.  Shower with CHG Soap the night before surgery and  the  morning of Surgery.  2.  If you choose to wash your hair, wash your hair first as usual with your  normal  shampoo.  3.  After you shampoo, rinse your hair and body thoroughly to remove the  shampoo.                           4.  Use CHG as you would any other liquid soap.  You can apply chg directly  to the skin and wash                       Gently with a scrungie or clean washcloth.  5.  Apply the CHG Soap to your body ONLY FROM THE NECK DOWN.   Do not use on face/ open                           Wound or open sores. Avoid contact with eyes, ears mouth and genitals (private parts).                       Wash face,  Genitals (private parts) with your normal soap.             6.  Wash thoroughly, paying special attention to the area where your surgery  will be performed.  7.  Thoroughly rinse your body with warm water from the neck down.  8.  DO NOT shower/wash with your normal soap after using and rinsing off  the CHG Soap.                9.  Pat yourself dry with a clean towel.            10.  Wear clean pajamas.            11.  Place clean sheets on your bed the night of your first shower and do not  sleep with pets. Day of Surgery : Do not apply any lotions/deodorants the morning of surgery.  Please wear clean clothes to the hospital/surgery center.  FAILURE TO FOLLOW THESE INSTRUCTIONS MAY RESULT IN THE CANCELLATION OF YOUR SURGERY PATIENT SIGNATURE_________________________________  NURSE SIGNATURE__________________________________  ________________________________________________________________________

## 2020-05-09 NOTE — H&P (Signed)
TOTAL HIP ADMISSION H&P  Patient is admitted for right total hip arthroplasty.  Subjective:  Chief Complaint: Right hip pain  HPI: Devon Fowler, 60 y.o. male, has a history of pain and functional disability in the right hip due to arthritis and patient has failed non-surgical conservative treatments for greater than 12 weeks to include supervised PT with diminished ADL's post treatment and activity modification. Onset of symptoms was gradual, starting several years ago with gradually worsening course since that time. The patient noted no past surgery on the right hip. Patient currently rates pain in the right hip at 8 out of 10 with activity. Patient has night pain, worsening of pain with activity and weight bearing and pain that interfers with activities of daily living. Patient has evidence of bone-on-bone arthritis with moderate osteophyte formation by imaging studies. This condition presents safety issues increasing the risk of falls. There is no current active infection.  Patient Active Problem List   Diagnosis Date Noted  . Hyperlipemia 04/17/2017  . Ankle fracture, left 02/14/2013  . Closed left ankle fracture 02/14/2013    Past Medical History:  Diagnosis Date  . High cholesterol     Past Surgical History:  Procedure Laterality Date  . ORIF ANKLE FRACTURE Left 02/14/2013   DR Roda Shutters  . ORIF ANKLE FRACTURE Left 02/14/2013   Procedure: OPEN REDUCTION INTERNAL FIXATION (ORIF) ANKLE FRACTURE;  Surgeon: Cheral Almas, MD;  Location: [facility] OR;  Service: Orthopedics;  Laterality: Left;  Marland Kitchen VASECTOMY  2003    Prior to Admission medications   Medication Sig Start Date End Date Taking? Authorizing Provider  acetaminophen-codeine (TYLENOL #3) 300-30 MG tablet Take 1 tablet by mouth every 6 (six) hours as needed for pain. 04/20/20  Yes [provider]  Alpha-Lipoic Acid 600 MG CAPS Take 600 mg by mouth daily.   Yes [provider]  ARGININE PO Take 750 mg by mouth daily.    Yes [provider]  Ascorbic Acid (VITAMIN C) 1000 MG tablet Take 1,000 mg by mouth daily.   Yes [provider]  aspirin 81 MG EC tablet Take 81 mg by mouth daily. Swallow whole.   Yes [provider]  Cholecalciferol (VITAMIN D-3) 5000 UNIT/ML LIQD Place 5,000 Units under the tongue 2 (two) times a week.   Yes [provider]  Coenzyme Q10 (CO Q-10) 200 MG CAPS Take 200 mg by mouth 2 (two) times daily.   Yes [provider]  dorzolamide-timolol (COSOPT) 22.3-6.8 MG/ML ophthalmic solution Place 1 drop into both eyes 2 (two) times daily.   Yes [provider]  ezetimibe (ZETIA) 10 MG tablet TAKE ONE TABLET BY MOUTH DAILY Patient taking differently: Take 10 mg by mouth daily. 04/02/20  Yes Dorothyann Peng, MD  finasteride (PROSCAR) 5 MG tablet Take 2.5 mg by mouth daily. 04/17/17  Yes [provider]  Glucosamine HCl 1000 MG TABS Take 1,000 mg by mouth daily.   Yes [provider]  GLUTATHIONE PO Take 250 mg by mouth daily.   Yes [provider]  latanoprost (XALATAN) 0.005 % ophthalmic solution Place 1 drop into both eyes at bedtime.   Yes [provider]  Menaquinone-7 (VITAMIN K2 PO) Take 1 tablet by mouth daily.   Yes [provider]  metFORMIN (GLUCOPHAGE) 850 MG tablet Take 1 tablet (850 mg total) by mouth 2 (two) times daily with a meal. 04/05/20 04/05/21 Yes Ghumman, Ramandeep, NP  minoxidil (LONITEN) 2.5 MG tablet Take 0.625 mg by  mouth daily.   Yes [provider]  Multiple Vitamin (MULTIVITAMIN) tablet Take 1 tablet by mouth daily.   Yes [provider]  naproxen sodium (ALEVE) 220 MG tablet Take 440 mg by mouth 2 (two) times daily with a meal.   Yes [provider]  omega-3 acid ethyl esters (LOVAZA) 1 g capsule Take 1 g by mouth daily.   Yes [provider]  Resveratrol 250 MG CAPS Take 250 mg by mouth at bedtime.   Yes [provider]  rosuvastatin  (CRESTOR) 40 MG tablet Take 40 mg by mouth at bedtime.   Yes [provider]  Testosterone 5.5 MG/ACT GEL Apply 1 application topically 2 (two) times a week.   Yes [provider]  vitamin E 180 MG (400 UNITS) capsule Take 400 Units by mouth every morning.   Yes [provider]  Continuous Blood Gluc Sensor (FREESTYLE LIBRE 14 DAY SENSOR) MISC USE TO CHECK BLOOD SUGAR FOUR TIMES A DAY AS DIRECTED 07/21/19   Dorothyann Peng, MD    Allergies  Allergen Reactions  . Lamisil [Terbinafine] Hives    Social History   Socioeconomic History  . Marital status: Married    Spouse name: Not on file  . Number of children: Not on file  . Years of education: Not on file  . Highest education level: Not on file  Occupational History  . Not on file  Tobacco Use  . Smoking status: Never Smoker  . Smokeless tobacco: Never Used  Vaping Use  . Vaping Use: Never used  Substance and Sexual Activity  . Alcohol use: No  . Drug use: No  . Sexual activity: Not on file  Other Topics Concern  . Not on file  Social History Narrative  . Not on file   Social Determinants of Health   Financial Resource Strain: Not on file  Food Insecurity: Not on file  Transportation Needs: Not on file  Physical Activity: Not on file  Stress: Not on file  Social Connections: Not on file  Intimate Partner Violence: Not on file    Tobacco Use: Not on file   Social History   Substance and Sexual Activity  Alcohol Use No    Family History  Problem Relation Age of Onset  . Diabetes Mother   . Heart attack Father     Review of Systems  Constitutional: Negative for chills and fever.  HENT: Negative for congestion, sore throat and tinnitus.   Eyes: Negative for double vision, photophobia and pain.  Respiratory: Negative for cough, shortness of breath and wheezing.   Cardiovascular: Negative for chest pain, palpitations and orthopnea.  Gastrointestinal: Negative for heartburn, nausea and  vomiting.  Genitourinary: Negative for dysuria, frequency and urgency.  Musculoskeletal: Positive for joint pain.  Neurological: Negative for dizziness, weakness and headaches.     Objective:  Physical Exam: Well nourished and well developed.  General: Alert and oriented x3, cooperative and pleasant, no acute distress.  Head: normocephalic, atraumatic, neck supple.  Eyes: EOMI.  Respiratory: breath sounds clear in all fields, no wheezing, rales, or rhonchi. Cardiovascular: Regular rate and rhythm, no murmurs, gallops or rubs.  Abdomen: non-tender to palpation and soft, normoactive bowel sounds. Musculoskeletal:  Right Hip Exam:  The range of motion: Flexion to 100 degrees, Internal Rotation to 0 degrees, External Rotation to 20 degrees, and abduction to 20 degrees without discomfort.  There is no tenderness over the greater trochanteric bursa.  There is no pain on provocative  testing of the hip.   Calves soft and nontender. Motor function intact in LE. Strength 5/5 LE bilaterally. Neuro: Distal pulses 2+. Sensation to light touch intact in LE.  Imaging Review Plain radiographs demonstrate severe degenerative joint disease of the right hip. The bone quality appears to be adequate for age and reported activity level.  Assessment/Plan:  End stage arthritis, right hip  The patient history, physical examination, clinical judgement of the provider and imaging studies are consistent with end stage degenerative joint disease of the right hip and total hip arthroplasty is deemed medically necessary. The treatment options including medical management, injection therapy, arthroscopy and arthroplasty were discussed at length. The risks and benefits of total hip arthroplasty were presented and reviewed. The risks due to aseptic loosening, infection, stiffness, dislocation/subluxation, thromboembolic complications and other imponderables were discussed. The patient acknowledged the explanation,  agreed to proceed with the plan and consent was signed. Patient is being admitted for inpatient treatment for surgery, pain control, PT, OT, prophylactic antibiotics, VTE prophylaxis, progressive ambulation and ADLs and discharge planning.The patient is planning to be discharged home.   Patient's anticipated LOS is less than 2 midnights, meeting these requirements: - Younger than 39 - Lives within 1 hour of care - Has a competent adult at home to recover with post-op recover - NO history of  - Chronic pain requiring opiods  - Diabetes  - Coronary Artery Disease  - Heart failure  - Heart attack  - Stroke  - DVT/VTE  - Cardiac arrhythmia  - Respiratory Failure/COPD  - Renal failure  - Anemia  - Advanced Liver disease  Therapy Plans: HEP Disposition: Home with wife Planned DVT Prophylaxis: Aspirin 325 mg BID DME Needed: Dan Humphreys PCP: Velna Hatchet, MD (clearance received) Cardiologist: Reynolds Bowl, MD (clearance received) TXA: IV Allergies: Terbinafine (hives) Anesthesia Concerns: None BMI: 20.8 Last HgbA1c: 5.9% [facility]: [facility]  Other: SDD  - Patient was instructed on what medications to stop prior to surgery. - Follow-up visit in 2 weeks with Dr. Lequita Halt - Begin physical therapy following surgery - Pre-operative lab work as pre-surgical testing - Prescriptions will be provided in hospital at time of discharge  Arther Abbott, PA-C Orthopedic Surgery [facility] [place]

## 2020-05-11 ENCOUNTER — Encounter (HOSPITAL_COMMUNITY): Payer: Self-pay

## 2020-05-11 ENCOUNTER — Encounter (HOSPITAL_COMMUNITY)
Admission: RE | Admit: 2020-05-11 | Discharge: 2020-05-11 | Disposition: A | Discharge status: Home / Self Care | Payer: 59 | Source: Ambulatory Visit | Attending: Orthopedic Surgery | Admitting: Orthopedic Surgery

## 2020-05-11 ENCOUNTER — Other Ambulatory Visit: Payer: Self-pay

## 2020-05-11 DIAGNOSIS — Z01812 Encounter for preprocedural laboratory examination: Secondary | ICD-10-CM | POA: Insufficient documentation

## 2020-05-11 HISTORY — DX: Unspecified osteoarthritis, unspecified site: M19.90

## 2020-05-11 HISTORY — DX: Prediabetes: R73.03

## 2020-05-11 LAB — CBC
HCT: 45.1 % (ref 39.0–52.0)
Hemoglobin: 14 g/dL (ref 13.0–17.0)
MCH: 25.6 pg — ABNORMAL LOW (ref 26.0–34.0)
MCHC: 31 g/dL (ref 30.0–36.0)
MCV: 82.6 fL (ref 80.0–100.0)
Platelets: 213 10*3/uL (ref 150–400)
RBC: 5.46 MIL/uL (ref 4.22–5.81)
RDW: 13.5 % (ref 11.5–15.5)
WBC: 6 10*3/uL (ref 4.0–10.5)
nRBC: 0 % (ref 0.0–0.2)

## 2020-05-11 LAB — COMPREHENSIVE METABOLIC PANEL
ALT: 52 U/L — ABNORMAL HIGH (ref 0–44)
AST: 44 U/L — ABNORMAL HIGH (ref 15–41)
Albumin: 4.5 g/dL (ref 3.5–5.0)
Alkaline Phosphatase: 35 U/L — ABNORMAL LOW (ref 38–126)
Anion gap: 7 (ref 5–15)
BUN: 17 mg/dL (ref 8–23)
CO2: 28 mmol/L (ref 22–32)
Calcium: 9.6 mg/dL (ref 8.9–10.3)
Chloride: 105 mmol/L (ref 98–111)
Creatinine, Ser: 0.81 mg/dL (ref 0.61–1.24)
GFR, Estimated: >60 mL/min (ref >60)
Glucose, Bld: 110 mg/dL — ABNORMAL HIGH (ref 70–99)
Potassium: 3.9 mmol/L (ref 3.5–5.1)
Sodium: 140 mmol/L (ref 135–145)
Total Bilirubin: 0.7 mg/dL (ref 0.3–1.2)
Total Protein: 7.7 g/dL (ref 6.5–8.1)

## 2020-05-11 LAB — HEMOGLOBIN A1C
Hgb A1c MFr Bld: 6 % — ABNORMAL HIGH (ref 4.8–5.6)
Mean Plasma Glucose: 125.5 mg/dL

## 2020-05-11 LAB — SURGICAL PCR SCREEN
MRSA, PCR: NEGATIVE
Staphylococcus aureus: NEGATIVE

## 2020-05-11 LAB — PROTIME-INR
INR: 1.1 (ref 0.8–1.2)
Prothrombin Time: 13.6 seconds (ref 11.4–15.2)

## 2020-05-11 LAB — APTT: aPTT: 28 seconds (ref 24–36)

## 2020-05-11 NOTE — Progress Notes (Addendum)
Anesthesia Review:  PCP: DR Dorothyann Peng  PT states cleared for surgery.  Called Kelly at DR Aluisio office on 05/11/20 for clearances.  She has not yet received any clearances back.  Tresa Endo to call DR Allyne Gee office for clearance.  Cardiologist : DR Aleene Davidson LOV 04/19/20  CT cardiac scorin- 07/2019  Chest x-ray : EKG :04/19/20 and 04/05/20  Requested ekg by fax from [facility]  EKG-04/19/20 on chart  Echo : Stress test: Cardiac Cath :  Activity level: can do a flight of stairs without difficulty  Sleep Study/ CPAP : no  Fasting Blood Sugar :      / Checks Blood Sugar -- times a day:   Blood Thinner/ Instructions /Last Dose: ASA / Instructions/ Last Dose :  Has Freestyle Libre  hgba1c-05/11/20- 6.0

## 2020-05-14 ENCOUNTER — Telehealth: Payer: Self-pay

## 2020-05-14 ENCOUNTER — Other Ambulatory Visit (HOSPITAL_COMMUNITY)
Admission: RE | Admit: 2020-05-14 | Discharge: 2020-05-14 | Disposition: A | Discharge status: Home / Self Care | Payer: 59 | Source: Ambulatory Visit | Attending: Orthopedic Surgery | Admitting: Orthopedic Surgery

## 2020-05-14 DIAGNOSIS — Z01812 Encounter for preprocedural laboratory examination: Secondary | ICD-10-CM | POA: Insufficient documentation

## 2020-05-14 DIAGNOSIS — Z20822 Contact with and (suspected) exposure to covid-19: Secondary | ICD-10-CM | POA: Insufficient documentation

## 2020-05-14 LAB — SARS CORONAVIRUS 2 (TAT 6-24 HRS): SARS Coronavirus 2: NEGATIVE

## 2020-05-14 NOTE — Telephone Encounter (Signed)
Devon Fowler w/[facility] called regarding surgical clearance papers. LVM that paper work was faxed over a couple weeks ago

## 2020-05-15 NOTE — Anesthesia Preprocedure Evaluation (Addendum)
Anesthesia Evaluation  Patient identified by MRN, date of birth, ID band Patient awake    Reviewed: Allergy & Precautions, NPO status , Patient's Chart, lab work & pertinent test results  History of Anesthesia Complications Negative for: history of anesthetic complications  Airway Mallampati: I  TM Distance: >3 FB Neck ROM: Full    Dental  (+) Dental Advisory Given, Teeth Intact   Pulmonary neg pulmonary ROS,    Pulmonary exam normal        Cardiovascular negative cardio ROS Normal cardiovascular exam     Neuro/Psych negative neurological ROS  negative psych ROS   GI/Hepatic negative GI ROS, Neg liver ROS,   Endo/Other  negative endocrine ROS Pre-DM   Renal/GU negative Renal ROS     Musculoskeletal  (+) Arthritis ,   Abdominal   Peds  Hematology negative hematology ROS (+)  Plt 213k    Anesthesia Other Findings Covid test negative HLD   Reproductive/Obstetrics                            Anesthesia Physical Anesthesia Plan  ASA: II  Anesthesia Plan: Spinal   Post-op Pain Management:    Induction:   PONV Risk Score and Plan: 1 and Treatment may vary due to age or medical condition and Propofol infusion  Airway Management Planned: Natural Airway and Simple Face Mask  Additional Equipment: None  Intra-op Plan:   Post-operative Plan:   Informed Consent: I have reviewed the patients History and Physical, chart, labs and discussed the procedure including the risks, benefits and alternatives for the proposed anesthesia with the patient or authorized representative who has indicated his/her understanding and acceptance.       Plan Discussed with: CRNA and Anesthesiologist  Anesthesia Plan Comments: (Labs reviewed, platelets acceptable. Discussed risks and benefits of spinal, including spinal/epidural hematoma, infection, failed block, and PDPH. Patient expressed  understanding and wished to proceed. )       Anesthesia Quick Evaluation

## 2020-05-16 ENCOUNTER — Encounter (HOSPITAL_COMMUNITY): Payer: Self-pay | Admitting: Orthopedic Surgery

## 2020-05-16 ENCOUNTER — Ambulatory Visit (HOSPITAL_COMMUNITY): Payer: 59

## 2020-05-16 ENCOUNTER — Encounter (HOSPITAL_COMMUNITY)
Admission: RE | Disposition: A | Discharge status: Home / Self Care | Payer: Self-pay | Source: Home / Self Care | Attending: Orthopedic Surgery

## 2020-05-16 ENCOUNTER — Ambulatory Visit (HOSPITAL_COMMUNITY)
Admission: RE | Admit: 2020-05-16 | Discharge: 2020-05-17 | Disposition: A | Discharge status: Home / Self Care | Payer: 59 | Attending: Orthopedic Surgery | Admitting: Orthopedic Surgery

## 2020-05-16 ENCOUNTER — Other Ambulatory Visit: Payer: Self-pay

## 2020-05-16 ENCOUNTER — Ambulatory Visit (HOSPITAL_COMMUNITY): Payer: 59 | Admitting: Physician Assistant

## 2020-05-16 ENCOUNTER — Ambulatory Visit (HOSPITAL_COMMUNITY): Payer: 59 | Admitting: Anesthesiology

## 2020-05-16 DIAGNOSIS — Z96649 Presence of unspecified artificial hip joint: Secondary | ICD-10-CM

## 2020-05-16 DIAGNOSIS — Z419 Encounter for procedure for purposes other than remedying health state, unspecified: Secondary | ICD-10-CM

## 2020-05-16 DIAGNOSIS — Z888 Allergy status to other drugs, medicaments and biological substances status: Secondary | ICD-10-CM | POA: Diagnosis not present

## 2020-05-16 DIAGNOSIS — M1611 Unilateral primary osteoarthritis, right hip: Secondary | ICD-10-CM | POA: Diagnosis not present

## 2020-05-16 DIAGNOSIS — R7303 Prediabetes: Secondary | ICD-10-CM | POA: Diagnosis not present

## 2020-05-16 DIAGNOSIS — M169 Osteoarthritis of hip, unspecified: Secondary | ICD-10-CM | POA: Diagnosis present

## 2020-05-16 DIAGNOSIS — Z7984 Long term (current) use of oral hypoglycemic drugs: Secondary | ICD-10-CM | POA: Diagnosis not present

## 2020-05-16 DIAGNOSIS — Z79899 Other long term (current) drug therapy: Secondary | ICD-10-CM | POA: Insufficient documentation

## 2020-05-16 DIAGNOSIS — Z7982 Long term (current) use of aspirin: Secondary | ICD-10-CM | POA: Diagnosis not present

## 2020-05-16 HISTORY — PX: TOTAL HIP ARTHROPLASTY: SHX124

## 2020-05-16 LAB — TYPE AND SCREEN
ABO/RH(D): B NEG
Antibody Screen: NEGATIVE

## 2020-05-16 LAB — GLUCOSE, CAPILLARY
Glucose-Capillary: 102 mg/dL — ABNORMAL HIGH (ref 70–99)
Glucose-Capillary: 168 mg/dL — ABNORMAL HIGH (ref 70–99)
Glucose-Capillary: 146 mg/dL — ABNORMAL HIGH (ref 70–99)

## 2020-05-16 LAB — ABO/RH: ABO/RH(D): B NEG

## 2020-05-16 SURGERY — ARTHROPLASTY, HIP, TOTAL, ANTERIOR APPROACH
Anesthesia: Spinal | Site: Hip | Laterality: Right

## 2020-05-16 MED ORDER — LIDOCAINE HCL (CARDIAC) PF 100 MG/5ML IV SOSY
PREFILLED_SYRINGE | INTRAVENOUS | Status: DC | PRN
  Administered 2020-05-16: 50 mg via INTRAVENOUS

## 2020-05-16 MED ORDER — ONDANSETRON HCL 4 MG/2ML IJ SOLN: 4.0000 mg | Freq: Once | INTRAMUSCULAR | Status: DC | PRN

## 2020-05-16 MED ORDER — LATANOPROST 0.005 % OP SOLN
1.0000 [drp] | Freq: Every day | OPHTHALMIC | Status: DC
  Filled 2020-05-16: qty 2.5

## 2020-05-16 MED ORDER — FENTANYL CITRATE (PF) 100 MCG/2ML IJ SOLN: 25.0000 ug | INTRAMUSCULAR | Status: DC | PRN

## 2020-05-16 MED ORDER — OXYCODONE HCL 5 MG PO TABS
5.0000 mg | ORAL_TABLET | ORAL | Status: DC | PRN
  Administered 2020-05-17 (×2): 10 mg via ORAL
  Filled 2020-05-16 (×2): qty 2

## 2020-05-16 MED ORDER — PHENOL 1.4 % MT LIQD: 1.0000 | OROMUCOSAL | Status: DC | PRN

## 2020-05-16 MED ORDER — OXYCODONE HCL 5 MG/5ML PO SOLN: 5.0000 mg | Freq: Once | ORAL | Status: DC | PRN

## 2020-05-16 MED ORDER — MIDAZOLAM HCL 2 MG/2ML IJ SOLN
INTRAMUSCULAR | Status: AC
  Filled 2020-05-16: qty 2

## 2020-05-16 MED ORDER — ONDANSETRON HCL 4 MG/2ML IJ SOLN: 4.0000 mg | Freq: Four times a day (QID) | INTRAMUSCULAR | Status: DC | PRN

## 2020-05-16 MED ORDER — DEXAMETHASONE SODIUM PHOSPHATE 10 MG/ML IJ SOLN
8.0000 mg | Freq: Once | INTRAMUSCULAR | Status: AC
  Administered 2020-05-16: 8 mg via INTRAVENOUS

## 2020-05-16 MED ORDER — POVIDONE-IODINE 10 % EX SWAB
2.0000 "application " | Freq: Once | CUTANEOUS | Status: AC
  Administered 2020-05-16: 2 via TOPICAL

## 2020-05-16 MED ORDER — ONDANSETRON HCL 4 MG/2ML IJ SOLN
INTRAMUSCULAR | Status: DC | PRN
  Administered 2020-05-16: 4 mg via INTRAVENOUS

## 2020-05-16 MED ORDER — DEXAMETHASONE SODIUM PHOSPHATE 10 MG/ML IJ SOLN
INTRAMUSCULAR | Status: AC
  Filled 2020-05-16: qty 1

## 2020-05-16 MED ORDER — TRAMADOL HCL 50 MG PO TABS
50.0000 mg | ORAL_TABLET | Freq: Four times a day (QID) | ORAL | Status: DC | PRN
  Administered 2020-05-16 (×2): 50 mg via ORAL
  Filled 2020-05-16 (×2): qty 1

## 2020-05-16 MED ORDER — PROPOFOL 500 MG/50ML IV EMUL
INTRAVENOUS | Status: AC
  Filled 2020-05-16: qty 50

## 2020-05-16 MED ORDER — FENTANYL CITRATE (PF) 100 MCG/2ML IJ SOLN
INTRAMUSCULAR | Status: DC | PRN
  Administered 2020-05-16: 50 ug via INTRAVENOUS

## 2020-05-16 MED ORDER — OXYCODONE HCL 5 MG PO TABS
5.0000 mg | ORAL_TABLET | Freq: Once | ORAL | Status: DC | PRN
Start: 2020-05-16 — End: 2020-05-16

## 2020-05-16 MED ORDER — BISACODYL 10 MG RE SUPP: 10.0000 mg | Freq: Every day | RECTAL | Status: DC | PRN

## 2020-05-16 MED ORDER — 0.9 % SODIUM CHLORIDE (POUR BTL) OPTIME
TOPICAL | Status: DC | PRN
  Administered 2020-05-16: 1000 mL

## 2020-05-16 MED ORDER — CHLORHEXIDINE GLUCONATE 0.12 % MT SOLN
15.0000 mL | Freq: Once | OROMUCOSAL | Status: AC
  Administered 2020-05-16: 15 mL via OROMUCOSAL

## 2020-05-16 MED ORDER — CEFAZOLIN SODIUM-DEXTROSE 2-4 GM/100ML-% IV SOLN
2.0000 g | INTRAVENOUS | Status: AC
  Administered 2020-05-16: 2 g via INTRAVENOUS
  Filled 2020-05-16: qty 100

## 2020-05-16 MED ORDER — FINASTERIDE 5 MG PO TABS
2.5000 mg | ORAL_TABLET | Freq: Every day | ORAL | Status: DC
  Filled 2020-05-16 (×2): qty 0.5

## 2020-05-16 MED ORDER — INSULIN ASPART 100 UNIT/ML ~~LOC~~ SOLN: 0.0000 [IU] | Freq: Three times a day (TID) | SUBCUTANEOUS | Status: DC

## 2020-05-16 MED ORDER — CEFAZOLIN SODIUM-DEXTROSE 2-4 GM/100ML-% IV SOLN
2.0000 g | Freq: Four times a day (QID) | INTRAVENOUS | Status: AC
  Administered 2020-05-16 (×2): 2 g via INTRAVENOUS
  Filled 2020-05-16 (×2): qty 100

## 2020-05-16 MED ORDER — TRANEXAMIC ACID-NACL 1000-0.7 MG/100ML-% IV SOLN
1000.0000 mg | INTRAVENOUS | Status: AC
  Administered 2020-05-16: 1000 mg via INTRAVENOUS
  Filled 2020-05-16: qty 100

## 2020-05-16 MED ORDER — PROPOFOL 500 MG/50ML IV EMUL
INTRAVENOUS | Status: DC | PRN
  Administered 2020-05-16: 25 ug/kg/min via INTRAVENOUS
  Administered 2020-05-16: 50 ug/kg/min via INTRAVENOUS

## 2020-05-16 MED ORDER — METHOCARBAMOL 500 MG PO TABS
500.0000 mg | ORAL_TABLET | Freq: Four times a day (QID) | ORAL | Status: DC | PRN
  Administered 2020-05-17: 500 mg via ORAL
  Filled 2020-05-16 (×2): qty 1

## 2020-05-16 MED ORDER — ACETAMINOPHEN 10 MG/ML IV SOLN
1000.0000 mg | Freq: Four times a day (QID) | INTRAVENOUS | Status: DC
  Administered 2020-05-16: 1000 mg via INTRAVENOUS
  Filled 2020-05-16: qty 100

## 2020-05-16 MED ORDER — WATER FOR IRRIGATION, STERILE IR SOLN
Status: DC | PRN
  Administered 2020-05-16: 2000 mL

## 2020-05-16 MED ORDER — DOCUSATE SODIUM 100 MG PO CAPS
100.0000 mg | ORAL_CAPSULE | Freq: Two times a day (BID) | ORAL | Status: DC
  Filled 2020-05-16: qty 1

## 2020-05-16 MED ORDER — MIDAZOLAM HCL 5 MG/5ML IJ SOLN
INTRAMUSCULAR | Status: DC | PRN
  Administered 2020-05-16 (×2): 1 mg via INTRAVENOUS

## 2020-05-16 MED ORDER — BUPIVACAINE-EPINEPHRINE (PF) 0.25% -1:200000 IJ SOLN
INTRAMUSCULAR | Status: DC | PRN
  Administered 2020-05-16: 30 mL

## 2020-05-16 MED ORDER — FENTANYL CITRATE (PF) 100 MCG/2ML IJ SOLN
INTRAMUSCULAR | Status: AC
  Filled 2020-05-16: qty 2

## 2020-05-16 MED ORDER — RESVERATROL 250 MG PO CAPS: 250.0000 mg | ORAL_CAPSULE | Freq: Every day | ORAL | Status: DC

## 2020-05-16 MED ORDER — MENTHOL 3 MG MT LOZG: 1.0000 | LOZENGE | OROMUCOSAL | Status: DC | PRN

## 2020-05-16 MED ORDER — ONDANSETRON HCL 4 MG PO TABS
4.0000 mg | ORAL_TABLET | Freq: Four times a day (QID) | ORAL | Status: DC | PRN
  Filled 2020-05-16: qty 1

## 2020-05-16 MED ORDER — DEXAMETHASONE SODIUM PHOSPHATE 10 MG/ML IJ SOLN
10.0000 mg | Freq: Once | INTRAMUSCULAR | Status: AC
Start: 2020-05-17 — End: 2020-05-17
  Administered 2020-05-17: 10 mg via INTRAVENOUS
  Filled 2020-05-16: qty 1

## 2020-05-16 MED ORDER — LIDOCAINE 2% (20 MG/ML) 5 ML SYRINGE
INTRAMUSCULAR | Status: AC
  Filled 2020-05-16: qty 5

## 2020-05-16 MED ORDER — METHOCARBAMOL 500 MG IVPB - SIMPLE MED
500.0000 mg | Freq: Four times a day (QID) | INTRAVENOUS | Status: DC | PRN
  Filled 2020-05-16: qty 50

## 2020-05-16 MED ORDER — FLEET ENEMA 7-19 GM/118ML RE ENEM: 1.0000 | ENEMA | Freq: Once | RECTAL | Status: DC | PRN

## 2020-05-16 MED ORDER — LACTATED RINGERS IV SOLN: INTRAVENOUS | Status: DC

## 2020-05-16 MED ORDER — SODIUM CHLORIDE 0.9 % IV SOLN: INTRAVENOUS | Status: DC

## 2020-05-16 MED ORDER — ROSUVASTATIN CALCIUM 20 MG PO TABS: 40.0000 mg | ORAL_TABLET | Freq: Every day | ORAL | Status: DC

## 2020-05-16 MED ORDER — DORZOLAMIDE HCL-TIMOLOL MAL 2-0.5 % OP SOLN
1.0000 [drp] | Freq: Two times a day (BID) | OPHTHALMIC | Status: DC
  Filled 2020-05-16: qty 10

## 2020-05-16 MED ORDER — METOCLOPRAMIDE HCL 5 MG PO TABS: 5.0000 mg | ORAL_TABLET | Freq: Three times a day (TID) | ORAL | Status: DC | PRN

## 2020-05-16 MED ORDER — ORAL CARE MOUTH RINSE: 15.0000 mL | Freq: Once | OROMUCOSAL | Status: AC

## 2020-05-16 MED ORDER — DIPHENHYDRAMINE HCL 12.5 MG/5ML PO ELIX: 12.5000 mg | ORAL_SOLUTION | ORAL | Status: DC | PRN

## 2020-05-16 MED ORDER — BUPIVACAINE-EPINEPHRINE (PF) 0.25% -1:200000 IJ SOLN
INTRAMUSCULAR | Status: AC
  Filled 2020-05-16: qty 30

## 2020-05-16 MED ORDER — METOCLOPRAMIDE HCL 5 MG/ML IJ SOLN: 5.0000 mg | Freq: Three times a day (TID) | INTRAMUSCULAR | Status: DC | PRN

## 2020-05-16 MED ORDER — ASPIRIN EC 325 MG PO TBEC
325.0000 mg | DELAYED_RELEASE_TABLET | Freq: Two times a day (BID) | ORAL | Status: DC
  Administered 2020-05-17: 325 mg via ORAL
  Filled 2020-05-16: qty 1

## 2020-05-16 MED ORDER — BUPIVACAINE IN DEXTROSE 0.75-8.25 % IT SOLN
INTRATHECAL | Status: DC | PRN
  Administered 2020-05-16: 1.6 mL via INTRATHECAL

## 2020-05-16 MED ORDER — POLYETHYLENE GLYCOL 3350 17 G PO PACK: 17.0000 g | PACK | Freq: Every day | ORAL | Status: DC | PRN

## 2020-05-16 MED ORDER — ACETAMINOPHEN 500 MG PO TABS
1000.0000 mg | ORAL_TABLET | Freq: Four times a day (QID) | ORAL | Status: AC
  Administered 2020-05-16 – 2020-05-17 (×3): 1000 mg via ORAL
  Filled 2020-05-16 (×3): qty 2

## 2020-05-16 MED ORDER — MORPHINE SULFATE (PF) 2 MG/ML IV SOLN: 1.0000 mg | INTRAVENOUS | Status: DC | PRN

## 2020-05-16 MED ORDER — EPHEDRINE SULFATE 50 MG/ML IJ SOLN
INTRAMUSCULAR | Status: DC | PRN
  Administered 2020-05-16: 5 mg via INTRAVENOUS
  Administered 2020-05-16: 10 mg via INTRAVENOUS
  Administered 2020-05-16: 5 mg via INTRAVENOUS

## 2020-05-16 MED ORDER — EZETIMIBE 10 MG PO TABS
10.0000 mg | ORAL_TABLET | Freq: Every day | ORAL | Status: DC
  Filled 2020-05-16: qty 1

## 2020-05-16 SURGICAL SUPPLY — 41 items
BAG DECANTER FOR FLEXI CONT (MISCELLANEOUS) IMPLANT
BAG ZIPLOCK 12X15 (MISCELLANEOUS) IMPLANT
BLADE SAG 18X100X1.27 (BLADE) ×2 IMPLANT
COVER PERINEAL POST (MISCELLANEOUS) ×2 IMPLANT
COVER SURGICAL LIGHT HANDLE (MISCELLANEOUS) ×2 IMPLANT
COVER WAND RF STERILE (DRAPES) IMPLANT
CUP ACET PINNACLE SECTR 56MM (Hips) ×1 IMPLANT
DECANTER SPIKE VIAL GLASS SM (MISCELLANEOUS) ×2 IMPLANT
DRAPE STERI IOBAN 125X83 (DRAPES) ×2 IMPLANT
DRAPE U-SHAPE 47X51 STRL (DRAPES) ×4 IMPLANT
DRSG AQUACEL AG ADV 3.5X10 (GAUZE/BANDAGES/DRESSINGS) ×2 IMPLANT
DURAPREP 26ML APPLICATOR (WOUND CARE) ×2 IMPLANT
ELECT REM PT RETURN 15FT ADLT (MISCELLANEOUS) ×2 IMPLANT
EVACUATOR 1/8 PVC DRAIN (DRAIN) IMPLANT
GLOVE SRG 8 PF TXTR STRL LF DI (GLOVE) ×1 IMPLANT
GLOVE SURG ENC MOIS LTX SZ6.5 (GLOVE) ×2 IMPLANT
GLOVE SURG ENC MOIS LTX SZ8 (GLOVE) ×4 IMPLANT
GLOVE SURG UNDER POLY LF SZ7 (GLOVE) IMPLANT
GLOVE SURG UNDER POLY LF SZ8 (GLOVE) ×1
GLOVE SURG UNDER POLY LF SZ8.5 (GLOVE) IMPLANT
GOWN STRL REUS W/TWL LRG LVL3 (GOWN DISPOSABLE) ×2 IMPLANT
GOWN STRL REUS W/TWL XL LVL3 (GOWN DISPOSABLE) IMPLANT
HEAD CERAMIC DELTA 36 PLUS 1.5 (Hips) ×2 IMPLANT
HOLDER FOLEY CATH W/STRAP (MISCELLANEOUS) ×2 IMPLANT
KIT TURNOVER KIT A (KITS) ×2 IMPLANT
LINER MARATHON 4 NEUTRAL 36X56 (Hips) ×2 IMPLANT
MANIFOLD NEPTUNE II (INSTRUMENTS) ×2 IMPLANT
PACK ANTERIOR HIP CUSTOM (KITS) ×2 IMPLANT
PENCIL SMOKE EVACUATOR COATED (MISCELLANEOUS) ×2 IMPLANT
PINNACLE SECTOR CUP 56MM (Hips) ×2 IMPLANT
STEM FEM ACTIS HIGH SZ8 (Stem) ×2 IMPLANT
STRIP CLOSURE SKIN 1/2X4 (GAUZE/BANDAGES/DRESSINGS) ×2 IMPLANT
SUT ETHIBOND NAB CT1 #1 30IN (SUTURE) ×2 IMPLANT
SUT MNCRL AB 4-0 PS2 18 (SUTURE) ×2 IMPLANT
SUT STRATAFIX 0 PDS 27 VIOLET (SUTURE) ×2
SUT VIC AB 2-0 CT1 27 (SUTURE) ×2
SUT VIC AB 2-0 CT1 TAPERPNT 27 (SUTURE) ×2 IMPLANT
SUTURE STRATFX 0 PDS 27 VIOLET (SUTURE) ×1 IMPLANT
SYR 50ML LL SCALE MARK (SYRINGE) IMPLANT
TRAY FOLEY MTR SLVR 16FR STAT (SET/KITS/TRAYS/PACK) ×2 IMPLANT
TUBE SUCTION HIGH CAP CLEAR NV (SUCTIONS) ×2 IMPLANT

## 2020-05-16 NOTE — Op Note (Signed)
OPERATIVE REPORT- TOTAL HIP ARTHROPLASTY   PREOPERATIVE DIAGNOSIS: Osteoarthritis of the Right hip.   POSTOPERATIVE DIAGNOSIS: Osteoarthritis of the Right  hip.   PROCEDURE: Right total hip arthroplasty, anterior approach.   SURGEON: Ollen Ryer, MD   ASSISTANT: Arther Abbott, PA-C  ANESTHESIA:  Spinal  ESTIMATED BLOOD LOSS:-450 mL    DRAINS: Hemovac x1.   COMPLICATIONS: None   CONDITION: PACU - hemodynamically stable.   BRIEF CLINICAL NOTE: Devon Fowler is a 60 y.o. male who has advanced end-  stage arthritis of their Right  hip with progressively worsening pain and  dysfunction.The patient has failed nonoperative management and presents for  total hip arthroplasty.   PROCEDURE IN DETAIL: After successful administration of spinal  anesthetic, the traction boots for the [facility] bed were placed on both  feet and the patient was placed onto the [facility] bed, boots placed into the leg  holders. The Right hip was then isolated from the perineum with plastic  drapes and prepped and draped in the usual sterile fashion. ASIS and  greater trochanter were marked and a oblique incision was made, starting  at about 1 cm lateral and 2 cm distal to the ASIS and coursing towards  the anterior cortex of the femur. The skin was cut with a 10 blade  through subcutaneous tissue to the level of the fascia overlying the  tensor fascia lata muscle. The fascia was then incised in line with the  incision at the junction of the anterior third and posterior 2/3rd. The  muscle was teased off the fascia and then the interval between the TFL  and the rectus was developed. The Hohmann retractor was then placed at  the top of the femoral neck over the capsule. The vessels overlying the  capsule were cauterized and the fat on top of the capsule was removed.  A Hohmann retractor was then placed anterior underneath the rectus  femoris to give exposure to the entire anterior capsule. A T-shaped   capsulotomy was performed. The edges were tagged and the femoral head  was identified.       Osteophytes are removed off the superior acetabulum.  The femoral neck was then cut in situ with an oscillating saw. Traction  was then applied to the left lower extremity utilizing the [facility]  traction. The femoral head was then removed. Retractors were placed  around the acetabulum and then circumferential removal of the labrum was  performed. Osteophytes were also removed. Reaming starts at 51 mm to  medialize and  Increased in 2 mm increments to 55 mm. We reamed in  approximately 40 degrees of abduction, 20 degrees anteversion. A 56 mm  pinnacle acetabular shell was then impacted in anatomic position under  fluoroscopic guidance with excellent purchase. We did not need to place  any additional dome screws. A 36 mm neutral + 4 marathon liner was then  placed into the acetabular shell.       The femoral lift was then placed along the lateral aspect of the femur  just distal to the vastus ridge. The leg was  externally rotated and capsule  was stripped off the inferior aspect of the femoral neck down to the  level of the lesser trochanter, this was done with electrocautery. The femur was lifted after this was performed. The  leg was then placed in an extended and adducted position essentially delivering the femur. We also removed the capsule superiorly and the piriformis from the piriformis  fossa to gain excellent exposure of the  proximal femur. Rongeur was used to remove some cancellous bone to get  into the lateral portion of the proximal femur for placement of the  initial starter reamer. The starter broaches was placed  the starter broach  and was shown to go down the center of the canal. Broaching  with the Actis system was then performed starting at size 0  coursing  Up to size 8. A size 8 had excellent torsional and rotational  and axial stability. The trial high offset neck was then placed   with a 36 + 1.5 trial head. The hip was then reduced. We confirmed that  the stem was in the canal both on AP and lateral x-rays. It also has excellent sizing. The hip was reduced with outstanding stability through full extension and full external rotation.. AP pelvis was taken and the leg lengths were measured and found to be equal. Hip was then dislocated again and the femoral head and neck removed. The  femoral broach was removed. Size 8 Actis stem with a high offset  neck was then impacted into the femur following native anteversion. Has  excellent purchase in the canal. Excellent torsional and rotational and  axial stability. It is confirmed to be in the canal on AP and lateral  fluoroscopic views. The 36 + 1.5 ceramic head was placed and the hip  reduced with outstanding stability. Again AP pelvis was taken and it  confirmed that the leg lengths were equal. The wound was then copiously  irrigated with saline solution and the capsule reattached and repaired  with Ethibond suture. 30 ml of .25% Bupivicaine was  injected into the capsule and into the edge of the tensor fascia lata as well as subcutaneous tissue. The fascia overlying the tensor fascia lata was then closed with a running #1 V-Loc. Subcu was closed with interrupted 2-0 Vicryl and subcuticular running 4-0 Monocryl. Incision was cleaned  and dried. Steri-Strips and a bulky sterile dressing applied. Hemovac  drain was hooked to suction and then the patient was awakened and transported to  recovery in stable condition.        Please note that a surgical assistant was a medical necessity for this procedure to perform it in a safe and expeditious manner. Assistant was necessary to provide appropriate retraction of vital neurovascular structures and to prevent femoral fracture and allow for anatomic placement of the prosthesis.  Gaynelle Arabian, M.D.

## 2020-05-16 NOTE — Interval H&P Note (Signed)
History and Physical Interval Note:  05/16/2020 6:47 AM  Devon Fowler  has presented today for surgery, with the diagnosis of right hip osteoarthritis.  The various methods of treatment have been discussed with the patient and family. After consideration of risks, benefits and other options for treatment, the patient has consented to  Procedure(s) with comments: TOTAL HIP ARTHROPLASTY ANTERIOR APPROACH (Right) - as a surgical intervention.  The patient's history has been reviewed, patient examined, no change in status, stable for surgery.  I have reviewed the patient's chart and labs.  Questions were answered to the patient's satisfaction.     Homero Fellers Sangeeta Youse

## 2020-05-16 NOTE — Anesthesia Postprocedure Evaluation (Signed)
Anesthesia Post Note  Patient: Devon Fowler  Procedure(s) Performed: TOTAL HIP ARTHROPLASTY ANTERIOR APPROACH (Right Hip)     Patient location during evaluation: PACU Anesthesia Type: Spinal Level of consciousness: awake and alert Pain management: pain level controlled Vital Signs Assessment: post-procedure vital signs reviewed and stable Respiratory status: spontaneous breathing and respiratory function stable Cardiovascular status: blood pressure returned to baseline and stable Postop Assessment: spinal receding and no apparent nausea or vomiting Anesthetic complications: no   No complications documented.  Last Vitals:  Vitals:   05/16/20 1030 05/16/20 1045  BP: 113/72 103/85  Pulse: (!) 53 (!) 55  Resp: 15 12  Temp:    SpO2: 100% 100%    Last Pain:  Vitals:   05/16/20 1100  TempSrc:   PainSc: 0-No pain                 Beryle Lathe

## 2020-05-16 NOTE — Anesthesia Procedure Notes (Signed)
Spinal  Patient location during procedure: OR Start time: 05/16/2020 8:19 AM End time: 05/16/2020 8:21 AM Reason for block: surgical anesthesia Staffing Performed: anesthesiologist  Anesthesiologist: Beryle Lathe, MD Preanesthetic Checklist Completed: patient identified, IV checked, risks and benefits discussed, surgical consent, monitors and equipment checked, pre-op evaluation and timeout performed Spinal Block Patient position: sitting Prep: DuraPrep Patient monitoring: heart rate, cardiac monitor, continuous pulse ox and blood pressure Approach: midline Location: L3-4 Injection technique: single-shot Needle Needle type: Pencan  Needle gauge: 24 G Additional Notes Consent was obtained prior to the procedure with all questions answered and concerns addressed. Risks including, but not limited to, bleeding, infection, nerve damage, paralysis, failed block, inadequate analgesia, allergic reaction, high spinal, itching, and headache were discussed and the patient wished to proceed. Functioning IV was confirmed and monitors were applied. Sterile prep and drape, including hand hygiene, mask, and sterile gloves were used. The patient was positioned and the spine was prepped. The skin was anesthetized with lidocaine. Free flow of clear CSF was obtained prior to injecting local anesthetic into the CSF. The spinal needle aspirated freely following injection. The needle was carefully withdrawn. The patient tolerated the procedure well.   Leslye Peer, MD

## 2020-05-16 NOTE — Transfer of Care (Signed)
Immediate Anesthesia Transfer of Care Note  Patient: Devon Fowler  Procedure(s) Performed: TOTAL HIP ARTHROPLASTY ANTERIOR APPROACH (Right Hip)  Patient Location: PACU  Anesthesia Type:Spinal  Level of Consciousness: awake, alert , oriented and patient cooperative  Airway & Oxygen Therapy: Patient Spontanous Breathing and Patient connected to face mask oxygen  Post-op Assessment: Report given to RN and Post -op Vital signs reviewed and stable  Post vital signs: Reviewed and stable  Last Vitals:  Vitals Value Taken Time  BP 110/64 05/16/20 0956  Temp    Pulse 55 05/16/20 0958  Resp 16 05/16/20 0958  SpO2 100 % 05/16/20 0958  Vitals shown include unvalidated device data.  Last Pain:  Vitals:   05/16/20 0709  TempSrc:   PainSc: 0-No pain         Complications: No complications documented.

## 2020-05-16 NOTE — Evaluation (Signed)
Physical Therapy Evaluation Patient Details Name: Devon Fowler MRN: [id number] DOB: 05/17/1960 Today's Date: 05/16/2020   History of Present Illness  Patient is 60 y.o. male s/p Rt THA anterior approach on 05/16/20 with PMH significant for OA, prediabetes, Lt ankle ORIF in 2015.    Clinical Impression  Devon Fowler is a 60 y.o. male POD 0 s/p Rt THA. Patient reports independence with mobility at baseline. Patient is now limited by functional impairments (see PT problem list below) and requires min assist/guard for transfers and gait with RW. Patient was able to ambulate ~100 feet with RW and min guard/assist. Patient instructed in exercise to facilitate ROM and circulation to manage edema and reduce risk of DVT. Patient will benefit from continued skilled PT interventions to address impairments and progress towards PLOF. Acute PT will follow to progress mobility and stair training in preparation for safe discharge home.     Follow Up Recommendations Outpatient PT;Follow surgeon's recommendation for DC plan and follow-up therapies    Equipment Recommendations  Rolling walker with 5" wheels    Recommendations for Other Services       Precautions / Restrictions Precautions Precautions: Fall Restrictions Weight Bearing Restrictions: No Other Position/Activity Restrictions: WBAT      Mobility  Bed Mobility Overal bed mobility: Needs Assistance Bed Mobility: Supine to Sit     Supine to sit: Min guard;HOB elevated     General bed mobility comments: pt taking extra time and using bed rail, guarding for safety and min cues.    Transfers Overall transfer level: Needs assistance Equipment used: Rolling walker (2 wheeled) Transfers: Sit to/from Stand Sit to Stand: Min assist;From elevated surface         General transfer comment: Cues for safe hand placement, no assist for rise and pt steady once standing.  Ambulation/Gait Ambulation/Gait assistance: Min assist;Min guard Gait  Distance (Feet): 100 Feet Assistive device: Rolling walker (2 wheeled) Gait Pattern/deviations: Step-to pattern;Step-through pattern;Decreased stride length;Decreased weight shift to right Gait velocity: decr   General Gait Details: VC for safe step pattern and proximity to RW, no overt LOB and pt progressed to step through smoothly with min guard assist.  Stairs            Wheelchair Mobility    Modified Rankin (Stroke Patients Only)       Balance Overall balance assessment: Needs assistance Sitting-balance support: Feet supported Sitting balance-Leahy Scale: Good     Standing balance support: During functional activity;Bilateral upper extremity supported Standing balance-Leahy Scale: Fair                               Pertinent Vitals/Pain Pain Assessment: 0-10 Pain Score: 3  Pain Location: Rt hip Pain Descriptors / Indicators: Aching;Dull Pain Intervention(s): Limited activity within patient's tolerance;Repositioned;Ice applied    Home Living Family/patient expects to be discharged to:: Private residence Living Arrangements: Spouse/significant other Available Help at Discharge: Family Type of Home: House Home Access: Stairs to enter Entrance Stairs-Rails: None Entrance Stairs-Number of Steps: 1 Home Layout: Two level;Bed/bath upstairs;Full bath on main level;Able to live on main level with bedroom/bathroom Home Equipment: Gilmer Mor - single point      Prior Function Level of Independence: Independent         Comments: pt enjoys hiking and traveling     Hand Dominance   Dominant Hand: Right    Extremity/Trunk Assessment   Upper Extremity Assessment Upper Extremity Assessment: Overall  WFL for tasks assessed    Lower Extremity Assessment Lower Extremity Assessment: Overall WFL for tasks assessed    Cervical / Trunk Assessment Cervical / Trunk Assessment: Normal  Communication   Communication: No difficulties  Cognition  Arousal/Alertness: Awake/alert Behavior During Therapy: WFL for tasks assessed/performed Overall Cognitive Status: Within Functional Limits for tasks assessed                                        General Comments      Exercises Total Joint Exercises Ankle Circles/Pumps: AROM;Both;20 reps;Seated Quad Sets: AROM;Right;10 reps;Seated Heel Slides: AROM;Right;10 reps;Seated   Assessment/Plan    PT Assessment Patient needs continued PT services  PT Problem List Decreased strength;Decreased range of motion;Decreased balance;Decreased activity tolerance;Decreased mobility;Decreased knowledge of use of DME;Decreased knowledge of precautions;Pain       PT Treatment Interventions DME instruction;Gait training;Stair training;Functional mobility training;Therapeutic activities;Therapeutic exercise;Balance training;Patient/family education    PT Goals (Current goals can be found in the Care Plan section)  Acute Rehab PT Goals Patient Stated Goal: get back to hiking, travel, and going to the gym PT Goal Formulation: With patient Time For Goal Achievement: 05/23/20 Potential to Achieve Goals: Good    Frequency 7X/week   Barriers to discharge        Co-evaluation               AM-PAC PT "6 Clicks" Mobility  Outcome Measure Help needed turning from your back to your side while in a flat bed without using bedrails?: None Help needed moving from lying on your back to sitting on the side of a flat bed without using bedrails?: A Little Help needed moving to and from a bed to a chair (including a wheelchair)?: A Little Help needed standing up from a chair using your arms (e.g., wheelchair or bedside chair)?: A Little Help needed to walk in hospital room?: A Little Help needed climbing 3-5 steps with a railing? : A Little 6 Click Score: 19    End of Session Equipment Utilized During Treatment: Gait belt Activity Tolerance: Patient tolerated treatment well Patient  left: in chair;with call bell/phone within reach;with chair alarm set Nurse Communication: Mobility status PT Visit Diagnosis: Muscle weakness (generalized) (M62.81);Difficulty in walking, not elsewhere classified (R26.2)    Time: 1660-6301 PT Time Calculation (min) (ACUTE ONLY): 23 min   Charges:   PT Evaluation $PT Eval Low Complexity: 1 Low PT Treatments $Gait Training: 8-22 mins        Wynn Maudlin, DPT Acute Rehabilitation Services Office [phone] Pager [phone]    Anitra Lauth 05/16/2020, 4:32 PM

## 2020-05-16 NOTE — Discharge Instructions (Addendum)
Devon Arabian, MD Total Joint Specialist [facility] [place] [address], [address] [place], [place] [address] [phone]  ANTERIOR APPROACH TOTAL HIP REPLACEMENT POSTOPERATIVE DIRECTIONS     Hip Rehabilitation, Guidelines Following Surgery  The results of a hip operation are greatly improved after range of motion and muscle strengthening exercises. Follow all safety measures which are given to protect your hip. If any of these exercises cause increased pain or swelling in your joint, decrease the amount until you are comfortable again. Then slowly increase the exercises. Call your caregiver if you have problems or questions.   BLOOD CLOT PREVENTION . Take a 325 mg Aspirin two times a day for three weeks following surgery. Then resume one 81 mg aspirin once a day. Dennis Bast may resume your vitamins/supplements upon discharge from the hospital. . Do not take any NSAIDs (Advil, Aleve, Ibuprofen, Meloxicam, etc.) until you have discontinued the 325 mg Aspirin.  HOME CARE INSTRUCTIONS  . Remove items at home which could result in a fall. This includes throw rugs or furniture in walking pathways.   ICE to the affected hip as frequently as 20-30 minutes an hour and then as needed for pain and swelling. Continue to use ice on the hip for pain and swelling from surgery. You may notice swelling that will progress down to the foot and ankle. This is normal after surgery. Elevate the leg when you are not up walking on it.    Continue to use the breathing machine which will help keep your temperature down.  It is common for your temperature to cycle up and down following surgery, especially at night when you are not up moving around and exerting yourself.  The breathing machine keeps your lungs expanded and your temperature down.  DIET You may resume your previous home diet once your are discharged from the hospital.  DRESSING / WOUND CARE / SHOWERING . You have an adhesive waterproof  bandage over the incision. Leave this in place until your first follow-up appointment. Once you remove this you will not need to place another bandage.  . You may begin showering 3 days following surgery, but do not submerge the incision under water.  ACTIVITY . For the first 3-5 days, it is important to rest and keep the operative leg elevated. You should, as a general rule, rest for 50 minutes and walk/stretch for 10 minutes per hour. After 5 days, you may slowly increase activity as tolerated.  Marland Kitchen Perform the exercises you were provided twice a day for about 15-20 minutes each session. Begin these 2 days following surgery. . Walk with your walker as instructed. Use the walker until you are comfortable transitioning to a cane. Walk with the cane in the opposite hand of the operative leg. You may discontinue the cane once you are comfortable and walking steadily. . Avoid periods of inactivity such as sitting longer than an hour when not asleep. This helps prevent blood clots.  . Do not drive a car for 6 weeks or until released by your surgeon.  . Do not drive while taking narcotics.  TED HOSE STOCKINGS Wear the elastic stockings on both legs for three weeks following surgery during the day. You may remove them at night while sleeping.  WEIGHT BEARING Weight bearing as tolerated with assist device (walker, cane, etc) as directed, use it as long as suggested by your surgeon or therapist, typically at least 4-6 weeks.  POSTOPERATIVE CONSTIPATION PROTOCOL Constipation - defined medically as fewer than  three stools per week and severe constipation as less than one stool per week.  One of the most common issues patients have following surgery is constipation.  Even if you have a regular bowel pattern at home, your normal regimen is likely to be disrupted due to multiple reasons following surgery.  Combination of anesthesia, postoperative narcotics, change in appetite and fluid intake all can affect  your bowels.  In order to avoid complications following surgery, here are some recommendations in order to help you during your recovery period.  . Colace (docusate) - Pick up an over-the-counter form of Colace or another stool softener and take twice a day as long as you are requiring postoperative pain medications.  Take with a full glass of water daily.  If you experience loose stools or diarrhea, hold the colace until you stool forms back up.  If your symptoms do not get better within 1 week or if they get worse, check with your doctor. . Dulcolax (bisacodyl) - Pick up over-the-counter and take as directed by the product packaging as needed to assist with the movement of your bowels.  Take with a full glass of water.  Use this product as needed if not relieved by Colace only.  . MiraLax (polyethylene glycol) - Pick up over-the-counter to have on hand.  MiraLax is a solution that will increase the amount of water in your bowels to assist with bowel movements.  Take as directed and can mix with a glass of water, juice, soda, coffee, or tea.  Take if you go more than two days without a movement.Do not use MiraLax more than once per day. Call your doctor if you are still constipated or irregular after using this medication for 7 days in a row.  If you continue to have problems with postoperative constipation, please contact the office for further assistance and recommendations.  If you experience "the worst abdominal pain ever" or develop nausea or vomiting, please contact the office immediatly for further recommendations for treatment.  ITCHING  If you experience itching with your medications, try taking only a single pain pill, or even half a pain pill at a time.  You can also use Benadryl over the counter for itching or also to help with sleep.   MEDICATIONS See your medication summary on the "After Visit Summary" that the nursing staff will review with you prior to discharge.  You may have some home  medications which will be placed on hold until you complete the course of blood thinner medication.  It is important for you to complete the blood thinner medication as prescribed by your surgeon.  Continue your approved medications as instructed at time of discharge.  PRECAUTIONS If you experience chest pain or shortness of breath - call 911 immediately for transfer to the hospital emergency department.  If you develop a fever greater that 101 F, purulent drainage from wound, increased redness or drainage from wound, foul odor from the wound/dressing, or calf pain - CONTACT YOUR SURGEON.                                                   FOLLOW-UP APPOINTMENTS Make sure you keep all of your appointments after your operation with your surgeon and caregivers. You should call the office at the above phone number and make an appointment for approximately  two weeks after the date of your surgery or on the date instructed by your surgeon outlined in the "After Visit Summary".  RANGE OF MOTION AND STRENGTHENING EXERCISES  These exercises are designed to help you keep full movement of your hip joint. Follow your caregiver's or physical therapist's instructions. Perform all exercises about fifteen times, three times per day or as directed. Exercise both hips, even if you have had only one joint replacement. These exercises can be done on a training (exercise) mat, on the floor, on a table or on a bed. Use whatever works the best and is most comfortable for you. Use music or television while you are exercising so that the exercises are a pleasant break in your day. This will make your life better with the exercises acting as a break in routine you can look forward to.  . Lying on your back, slowly slide your foot toward your buttocks, raising your knee up off the floor. Then slowly slide your foot back down until your leg is straight again.  . Lying on your back spread your legs as far apart as you can without  causing discomfort.  . Lying on your side, raise your upper leg and foot straight up from the floor as far as is comfortable. Slowly lower the leg and repeat.  . Lying on your back, tighten up the muscle in the front of your thigh (quadriceps muscles). You can do this by keeping your leg straight and trying to raise your heel off the floor. This helps strengthen the largest muscle supporting your knee.  . Lying on your back, tighten up the muscles of your buttocks both with the legs straight and with the knee bent at a comfortable angle while keeping your heel on the floor.   POST-OPERATIVE OPIOID TAPER INSTRUCTIONS: . It is important to wean off of your opioid medication as soon as possible. If you do not need pain medication after your surgery it is ok to stop day one. Marland Kitchen Opioids include: o Codeine, Hydrocodone(Norco, Vicodin), Oxycodone(Percocet, oxycontin) and hydromorphone amongst others.  . Long term and even short term use of opiods can cause: o Increased pain response o Dependence o Constipation o Depression o Respiratory depression o And more.  . Withdrawal symptoms can include o Flu like symptoms o Nausea, vomiting o And more . Techniques to manage these symptoms o Hydrate well o Eat regular healthy meals o Stay active o Use relaxation techniques(deep breathing, meditating, yoga) . Do Not substitute Alcohol to help with tapering . If you have been on opioids for less than two weeks and do not have pain than it is ok to stop all together.  . Plan to wean off of opioids o This plan should start within one week post op of your joint replacement. o Maintain the same interval or time between taking each dose and first decrease the dose.  o Cut the total daily intake of opioids by one tablet each day o Next start to increase the time between doses. o The last dose that should be eliminated is the evening dose.     IF YOU ARE TRANSFERRED TO A SKILLED REHAB FACILITY If the  patient is transferred to a skilled rehab facility following release from the hospital, a list of the current medications will be sent to the facility for the patient to continue.  When discharged from the skilled rehab facility, please have the facility set up the patient's [place] prior to  being released. Also, the skilled facility will be responsible for providing the patient with their medications at time of release from the facility to include their pain medication, the muscle relaxants, and their blood thinner medication. If the patient is still at the rehab facility at time of the two week follow up appointment, the skilled rehab facility will also need to assist the patient in arranging follow up appointment in our office and any transportation needs.  MAKE SURE YOU:  . Understand these instructions.  . Get help right away if you are not doing well or get worse.    DENTAL ANTIBIOTICS:  In most cases prophylactic antibiotics for Dental procdeures after total joint surgery are not necessary.  Exceptions are as follows:  1. History of prior total joint infection  2. Severely immunocompromised (Organ Transplant, cancer chemotherapy, Rheumatoid biologic meds such as Humera)  3. Poorly controlled diabetes (A1C &gt; 8.0, blood glucose over 200)  If you have one of these conditions, contact your surgeon for an antibiotic prescription, prior to your dental procedure.    Pick up stool softner and laxative for home use following surgery while on pain medications. Do not submerge incision under water. Please use good hand washing techniques while changing dressing each day. May shower starting three days after surgery. Please use a clean towel to pat the incision dry following showers. Continue to use ice for pain and swelling after surgery. Do not use any lotions or creams on the incision until instructed by your surgeon.  

## 2020-05-17 ENCOUNTER — Encounter (HOSPITAL_COMMUNITY): Payer: Self-pay | Admitting: Orthopedic Surgery

## 2020-05-17 DIAGNOSIS — M1611 Unilateral primary osteoarthritis, right hip: Secondary | ICD-10-CM | POA: Diagnosis not present

## 2020-05-17 LAB — BASIC METABOLIC PANEL
Anion gap: 9 (ref 5–15)
BUN: 24 mg/dL — ABNORMAL HIGH (ref 8–23)
CO2: 27 mmol/L (ref 22–32)
Calcium: 9.3 mg/dL (ref 8.9–10.3)
Chloride: 102 mmol/L (ref 98–111)
Creatinine, Ser: 0.8 mg/dL (ref 0.61–1.24)
GFR, Estimated: >60 mL/min (ref >60)
Glucose, Bld: 141 mg/dL — ABNORMAL HIGH (ref 70–99)
Potassium: 4.1 mmol/L (ref 3.5–5.1)
Sodium: 138 mmol/L (ref 135–145)

## 2020-05-17 LAB — CBC
HCT: 35.8 % — ABNORMAL LOW (ref 39.0–52.0)
Hemoglobin: 11.3 g/dL — ABNORMAL LOW (ref 13.0–17.0)
MCH: 25.9 pg — ABNORMAL LOW (ref 26.0–34.0)
MCHC: 31.6 g/dL (ref 30.0–36.0)
MCV: 82.1 fL (ref 80.0–100.0)
Platelets: 176 10*3/uL (ref 150–400)
RBC: 4.36 MIL/uL (ref 4.22–5.81)
RDW: 13.2 % (ref 11.5–15.5)
WBC: 14.3 10*3/uL — ABNORMAL HIGH (ref 4.0–10.5)
nRBC: 0 % (ref 0.0–0.2)

## 2020-05-17 MED ORDER — OXYCODONE HCL 5 MG PO TABS: 5.0000 mg | ORAL_TABLET | Freq: Four times a day (QID) | ORAL | 0 refills | Status: DC | PRN

## 2020-05-17 MED ORDER — ASPIRIN 325 MG PO TBEC: 325.0000 mg | DELAYED_RELEASE_TABLET | Freq: Two times a day (BID) | ORAL | 0 refills | Status: DC

## 2020-05-17 MED ORDER — TRAMADOL HCL 50 MG PO TABS: 50.0000 mg | ORAL_TABLET | Freq: Four times a day (QID) | ORAL | 0 refills | Status: DC | PRN

## 2020-05-17 MED ORDER — METHOCARBAMOL 500 MG PO TABS: 500.0000 mg | ORAL_TABLET | Freq: Four times a day (QID) | ORAL | 0 refills | Status: DC | PRN

## 2020-05-17 NOTE — TOC Transition Note (Signed)
Transition of Care [facility]) - CM/SW Discharge Note  Patient Details  Name: Devon Fowler MRN: [id number] Date of Birth: 06/29/1960  [facility] CM/SW Contact:  Sherie Don, LCSW Phone Number: 05/17/2020, 9:23 AM  Clinical Narrative: Patient to discharge home today. CSW met with patient to review discharge plan and needs. Patient to discharge home with home exercise program (HEP) and will need a rolling walker. Patient has a cane at home. [employer] to deliver rolling walker to patient's room. TOC signing off.  Final next level of care: Home/Self Care Barriers to Discharge: No Barriers Identified  Patient Goals and CMS Choice Patient states their goals for this hospitalization and ongoing recovery are:: Discharge home with HEP and rolling walker CMS Medicare.gov Compare Post Acute Care list provided to:: Patient Choice offered to / list presented to : Patient  Discharge Plan and Services        DME Arranged: Walker rolling DME Agency: [employer] Representative spoke with at DME Agency: Pre-arranged in orthopedist's office  Readmission Risk Interventions No flowsheet data found.

## 2020-05-17 NOTE — Plan of Care (Signed)
  Problem: Education: Goal: Knowledge of the prescribed therapeutic regimen will improve Outcome: Adequate for Discharge Goal: Understanding of discharge needs will improve Outcome: Adequate for Discharge Goal: Individualized Educational Video(s) Outcome: Adequate for Discharge   Problem: Activity: Goal: Ability to avoid complications of mobility impairment will improve Outcome: Adequate for Discharge Goal: Ability to tolerate increased activity will improve Outcome: Adequate for Discharge   Problem: Clinical Measurements: Goal: Postoperative complications will be avoided or minimized Outcome: Adequate for Discharge   Problem: Pain Management: Goal: Pain level will decrease with appropriate interventions Outcome: Adequate for Discharge   Problem: Skin Integrity: Goal: Will show signs of wound healing Outcome: Adequate for Discharge   

## 2020-05-17 NOTE — Progress Notes (Signed)
Physical Therapy Treatment Patient Details Name: Devon Fowler MRN: [id number] DOB: 29-Apr-1960 Today's Date: 05/17/2020    History of Present Illness Patient is 60 y.o. male s/p Rt THA anterior approach on 05/16/20 with PMH significant for OA, prediabetes, Lt ankle ORIF in 2015.    PT Comments    Patient making excellent progress with acute PT. He advance gait distance to ~200' with RW and min assist/cues at start for safe walker management progressing to min guard. Patient required min guard and cues for safe stair sequencing and verbalized understanding of family guarding/assist. Educated pt on HEP for strength and ROM and all questions addressed. Pt will benefit from continued PT during acute stay and follow up OPPT to return to high level activities. He is at safe mobility level for discharge home at this time.   Follow Up Recommendations  Outpatient PT;Follow surgeon's recommendation for DC plan and follow-up therapies     Equipment Recommendations  Rolling walker with 5" wheels    Recommendations for Other Services       Precautions / Restrictions Precautions Precautions: Fall Restrictions Weight Bearing Restrictions: No Other Position/Activity Restrictions: WBAT    Mobility  Bed Mobility               General bed mobility comments: Pt OOB in recliner.    Transfers Overall transfer level: Needs assistance Equipment used: Rolling walker (2 wheeled) Transfers: Sit to/from Stand Sit to Stand: Min guard         General transfer comment: cues for safety as pt attempted to stand prior to RW being placed in front of him. no assist needed for rise or sit, pt taking extra time and using bil UE's for power up and to control lowering to chair.  Ambulation/Gait Ambulation/Gait assistance: Min assist;Min guard Gait Distance (Feet): 200 Feet Assistive device: Rolling walker (2 wheeled) Gait Pattern/deviations: Step-to pattern;Step-through pattern;Decreased stride  length;Decreased weight shift to right Gait velocity: decr   General Gait Details: Min guard with intermitttent assist and min cues for safe proximity to RW as pt has tendency to move it too far ahead while ambulating. No overt LOB noted throughout and pt improved position of walker with practice.   Stairs Stairs: Yes Stairs assistance: Min guard Stair Management: Two rails;Step to pattern;Forwards Number of Stairs: 8 General stair comments: cues for safe step sequencing and safe guarding position for family to provide. No LOB and guarding for safety. Pt verbalized understanding of family assist needed and guarding. Single curb negotiation performed with min guard/assist for walker stability.   Wheelchair Mobility    Modified Rankin (Stroke Patients Only)       Balance Overall balance assessment: Needs assistance Sitting-balance support: Feet supported Sitting balance-Leahy Scale: Good     Standing balance support: During functional activity;Bilateral upper extremity supported Standing balance-Leahy Scale: Fair                              Cognition Arousal/Alertness: Awake/alert Behavior During Therapy: WFL for tasks assessed/performed Overall Cognitive Status: Within Functional Limits for tasks assessed                                        Exercises Total Joint Exercises Ankle Circles/Pumps: AROM;Both;20 reps;Seated Quad Sets: AROM;Right;Seated;5 reps Short Arc Quad: AROM;Right;Seated;5 reps Heel Slides: Right;Seated;AAROM;5 reps Hip ABduction/ADduction: AROM;Right;Seated;5 reps Long  Arc Quad: AROM;Right;10 reps;Seated    General Comments        Pertinent Vitals/Pain Pain Assessment: 0-10 Pain Score: 7  Pain Location: Rt hip Pain Descriptors / Indicators: Aching;Dull Pain Intervention(s): Limited activity within patient's tolerance;Premedicated before session;Monitored during session;Repositioned;Ice applied    Home Living                       Prior Function            PT Goals (current goals can now be found in the care plan section) Acute Rehab PT Goals Patient Stated Goal: get back to hiking, travel, and going to the gym PT Goal Formulation: With patient Time For Goal Achievement: 05/23/20 Potential to Achieve Goals: Good Progress towards PT goals: Progressing toward goals    Frequency    7X/week      PT Plan Current plan remains appropriate    Co-evaluation              AM-PAC PT "6 Clicks" Mobility   Outcome Measure  Help needed turning from your back to your side while in a flat bed without using bedrails?: None Help needed moving from lying on your back to sitting on the side of a flat bed without using bedrails?: A Little Help needed moving to and from a bed to a chair (including a wheelchair)?: A Little Help needed standing up from a chair using your arms (e.g., wheelchair or bedside chair)?: A Little Help needed to walk in hospital room?: A Little Help needed climbing 3-5 steps with a railing? : A Little 6 Click Score: 19    End of Session Equipment Utilized During Treatment: Gait belt Activity Tolerance: Patient tolerated treatment well Patient left: in chair;with call bell/phone within reach;with chair alarm set Nurse Communication: Mobility status PT Visit Diagnosis: Muscle weakness (generalized) (M62.81);Difficulty in walking, not elsewhere classified (R26.2)     Time: 1505-6979 PT Time Calculation (min) (ACUTE ONLY): 33 min  Charges:  $Gait Training: 8-22 mins $Therapeutic Exercise: 8-22 mins                     Wynn Maudlin, DPT Acute Rehabilitation Services Office [phone] Pager [phone]     Anitra Lauth 05/17/2020, 12:27 PM

## 2020-05-17 NOTE — Progress Notes (Signed)
   Subjective: 1 Day Post-Op Procedure(s) (LRB): TOTAL HIP ARTHROPLASTY ANTERIOR APPROACH (Right) Patient reports pain as mild.   Patient seen in rounds by Dr. Lequita Halt. Patient is well, and has had no acute complaints or problems. No acute overnight events. Patient denies SOB, chest pain, or calf pain. Ambulated 100 feet with PT yesterday. Foley to be pulled this am.  We will continue therapy today.   Objective: Vital signs in last 24 hours: Temp:  [97 F (36.1 C)-98.3 F (36.8 C)] 98.3 F (36.8 C) (04/07 0625) Pulse Rate:  [48-69] 69 (04/07 0625) Resp:  [10-18] 18 (04/07 0625) BP: (96-127)/(47-85) 110/61 (04/07 0625) SpO2:  [95 %-100 %] 100 % (04/07 0625)  Intake/Output from previous day:  Intake/Output Summary (Last 24 hours) at 05/17/2020 0730 Last data filed at 05/17/2020 0640 [place] per 24 hour  Intake 4062.96 ml  Output 5700 ml  Net -1637.04 ml     Intake/Output this shift: No intake/output data recorded.  Labs: Recent Labs    05/17/20 0304  HGB 11.3*   Recent Labs    05/17/20 0304  WBC 14.3*  RBC 4.36  HCT 35.8*  PLT 176   Recent Labs    05/17/20 0304  NA 138  K 4.1  CL 102  CO2 27  BUN 24*  CREATININE 0.80  GLUCOSE 141*  CALCIUM 9.3   No results for input(s): LABPT, INR in the last 72 hours.  Exam: General - Patient is Alert and Oriented Extremity - Neurologically intact Neurovascular intact Intact pulses distally Dorsiflexion/Plantar flexion intact Dressing - dressing C/D/I Motor Function - intact, moving foot and toes well on exam.   Past Medical History:  Diagnosis Date  . Arthritis   . High cholesterol   . Pre-diabetes     Assessment/Plan: 1 Day Post-Op Procedure(s) (LRB): TOTAL HIP ARTHROPLASTY ANTERIOR APPROACH (Right) Principal Problem:   OA (osteoarthritis) of hip  Estimated body mass index is 23.39 kg/m as calculated from the following:   Height as of this encounter: 6' 0.4" (1.839 m).   Weight as of this encounter:  79.1 kg. Advance diet Up with therapy  DVT Prophylaxis - Aspirin and TED hose Weight bearing as tolerated. Continue therapy.  Plan for one session of PT this morning, and if meeting goals, will plan for discharge this afternoon.   Plan is to go Home after hospital stay.   Patient to follow up with Dr. Lequita Halt in two weeks in clinic.   The PDMP database was reviewed today prior to any opioid medications being prescribed to this patient.  Nelia Shi, MBA, PA-C Orthopedic Surgery [phone] 05/17/2020, 7:30 AM

## 2020-05-23 HISTORY — PX: CATARACT EXTRACTION: SUR2

## 2020-06-05 ENCOUNTER — Other Ambulatory Visit: Payer: Self-pay | Admitting: Internal Medicine

## 2020-06-11 ENCOUNTER — Encounter: Payer: Self-pay | Admitting: Internal Medicine

## 2020-06-11 ENCOUNTER — Other Ambulatory Visit: Payer: Self-pay

## 2020-06-11 ENCOUNTER — Ambulatory Visit (INDEPENDENT_AMBULATORY_CARE_PROVIDER_SITE_OTHER): Payer: 59 | Admitting: Internal Medicine

## 2020-06-11 VITALS — BP 116/76 | Temp 98.1°F | Ht 72.0 in | Wt 171.0 lb

## 2020-06-11 DIAGNOSIS — E291 Testicular hypofunction: Secondary | ICD-10-CM | POA: Insufficient documentation

## 2020-06-11 DIAGNOSIS — Z23 Encounter for immunization: Secondary | ICD-10-CM

## 2020-06-11 DIAGNOSIS — E7800 Pure hypercholesterolemia, unspecified: Secondary | ICD-10-CM

## 2020-06-11 DIAGNOSIS — Z0000 Encounter for general adult medical examination without abnormal findings: Secondary | ICD-10-CM

## 2020-06-11 DIAGNOSIS — R7303 Prediabetes: Secondary | ICD-10-CM | POA: Diagnosis not present

## 2020-06-11 LAB — POCT URINALYSIS DIPSTICK
Bilirubin, UA: NEGATIVE
Blood, UA: NEGATIVE
Glucose, UA: NEGATIVE
Ketones, UA: NEGATIVE
Leukocytes, UA: NEGATIVE
Nitrite, UA: NEGATIVE
Protein, UA: NEGATIVE
Spec Grav, UA: 1.015 (ref 1.010–1.025)
Urobilinogen, UA: 0.2 U/dL
pH, UA: 7 (ref 5.0–8.0)

## 2020-06-11 MED ORDER — ROSUVASTATIN CALCIUM 40 MG PO TABS: 40.0000 mg | ORAL_TABLET | Freq: Every day | ORAL | 2 refills | Status: DC

## 2020-06-11 MED ORDER — TETANUS-DIPHTH-ACELL PERTUSSIS 5-2.5-18.5 LF-MCG/0.5 IM SUSY
0.5000 mL | PREFILLED_SYRINGE | Freq: Once | INTRAMUSCULAR | Status: AC
  Administered 2020-06-11: 0.5 mL via INTRAMUSCULAR

## 2020-06-11 NOTE — Patient Instructions (Signed)

## 2020-06-11 NOTE — Progress Notes (Signed)
This visit occurred during the SARS-CoV-2 public health emergency.  Safety protocols were in place, including screening questions prior to the visit, additional usage of staff PPE, and extensive cleaning of exam room while observing appropriate contact time as indicated for disinfecting solutions.  Subjective:     Patient ID: Devon Fowler , male    DOB: 1960/06/03 , 60 y.o.   MRN: [id number]   Chief Complaint  Patient presents with  . Annual Exam    HPI  He presents today for a full physical examination. He has no specific concerns or complaints at this time. Since his last visit, he had right hip replacement on April 6th. He reports he is recovering well from the surgery. He plans to return to work in a couple of weeks.     Past Medical History:  Diagnosis Date  . Arthritis   . High cholesterol   . Pre-diabetes      Family History  Problem Relation Age of Onset  . Diabetes Mother   . Heart attack Father      Current Outpatient Medications:  .  Alpha-Lipoic Acid 600 MG CAPS, Take 600 mg by mouth daily., Disp: , Rfl:  .  ARGININE PO, Take 750 mg by mouth daily., Disp: , Rfl:  .  Ascorbic Acid (VITAMIN C) 1000 MG tablet, Take 1,000 mg by mouth daily., Disp: , Rfl:  .  aspirin EC 325 MG EC tablet, Take 1 tablet (325 mg total) by mouth 2 (two) times daily. Then take one 81 mg aspirin once a day for three weeks. Then discontinue aspirin., Disp: 42 tablet, Rfl: 0 .  Cholecalciferol (VITAMIN D-3) 5000 UNIT/ML LIQD, Place 5,000 Units under the tongue 2 (two) times a week., Disp: , Rfl:  .  Coenzyme Q10 (CO Q-10) 200 MG CAPS, Take 200 mg by mouth 2 (two) times daily., Disp: , Rfl:  .  Continuous Blood Gluc Sensor (FREESTYLE LIBRE 14 DAY SENSOR) MISC, USE TO CHECK BLOOD SUGAR FOUR TIMES A DAY AS DIRECTED, Disp: 6 each, Rfl: 2 .  dorzolamide-timolol (COSOPT) 22.3-6.8 MG/ML ophthalmic solution, Place 1 drop into both eyes 2 (two) times daily., Disp: , Rfl:  .  ezetimibe (ZETIA) 10 MG  tablet, TAKE ONE TABLET BY MOUTH DAILY, Disp: 90 tablet, Rfl: 0 .  finasteride (PROSCAR) 5 MG tablet, Take 2.5 mg by mouth daily., Disp: , Rfl:  .  GLUTATHIONE PO, Take 250 mg by mouth daily., Disp: , Rfl:  .  latanoprost (XALATAN) 0.005 % ophthalmic solution, Place 1 drop into both eyes at bedtime., Disp: , Rfl:  .  metFORMIN (GLUCOPHAGE) 850 MG tablet, Take 1 tablet (850 mg total) by mouth 2 (two) times daily with a meal., Disp: 180 tablet, Rfl: 3 .  minoxidil (LONITEN) 2.5 MG tablet, Take 0.625 mg by mouth daily., Disp: , Rfl:  .  Multiple Vitamin (MULTIVITAMIN) tablet, Take 1 tablet by mouth daily., Disp: , Rfl:  .  omega-3 acid ethyl esters (LOVAZA) 1 g capsule, Take 1 g by mouth daily., Disp: , Rfl:  .  Resveratrol 250 MG CAPS, Take 250 mg by mouth at bedtime., Disp: , Rfl:  .  Testosterone 5.5 MG/ACT GEL, Apply 1 application topically 2 (two) times a week., Disp: , Rfl:  .  vitamin E 180 MG (400 UNITS) capsule, Take 400 Units by mouth every morning., Disp: , Rfl:  .  Menaquinone-7 (VITAMIN K2 PO), Take 1 tablet by mouth daily., Disp: , Rfl:  .  nirmatrelvir/ritonavir EUA (  PAXLOVID) TABS, Take 3 tablets by mouth 2 (two) times daily for 5 days., Disp: 30 tablet, Rfl: 0 .  rosuvastatin (CRESTOR) 40 MG tablet, Take 1 tablet (40 mg total) by mouth at bedtime., Disp: 90 tablet, Rfl: 2   Allergies  Allergen Reactions  . Lamisil [Terbinafine] Hives    Men's preventive visit. Patient Health Questionnaire (PHQ-2) is  [place] Office Visit from 06/11/2020 in [facility]  PHQ-2 Total Score 0    . Patient is on a healthy diet. Marital status: Married. Relevant history for alcohol use is:  Social History   Substance and Sexual Activity  Alcohol Use Yes   Comment: occas   . Relevant history for tobacco use is:  Social History   Tobacco Use  Smoking Status Never Smoker  Smokeless Tobacco Never Used  .  Review of Systems  Constitutional: Negative.   HENT:  Negative.   Eyes: Negative.   Respiratory: Negative.   Cardiovascular: Negative.   Gastrointestinal: Negative.   Endocrine: Negative.   Genitourinary: Negative.   Musculoskeletal: Negative.   Skin: Negative.   Allergic/Immunologic: Negative.   Neurological: Negative.   Hematological: Negative.   Psychiatric/Behavioral: Negative.   All other systems reviewed and are negative.    Today's Vitals   06/11/20 1429  Temp: 98.1 F (36.7 C)  Weight: 171 lb (77.6 kg)  Height: 6' (1.829 m)  PainSc: 0-No pain   Body mass index is 23.19 kg/m.  Wt Readings from Last 3 Encounters:  06/11/20 171 lb (77.6 kg)  05/16/20 174 lb 6.1 oz (79.1 kg)  04/05/20 173 lb 3.2 oz (78.6 kg)   BP Readings from Last 3 Encounters:  05/17/20 112/66  05/11/20 120/80  04/05/20 114/60    Objective:  Physical Exam Vitals and nursing note reviewed.  Constitutional:      Appearance: Normal appearance.  HENT:     Head: Normocephalic and atraumatic.     Right Ear: Tympanic membrane, ear canal and external ear normal.     Left Ear: Tympanic membrane, ear canal and external ear normal.     Nose:     Comments: Masked     Mouth/Throat:     Comments: Masked  Eyes:     Extraocular Movements: Extraocular movements intact.     Conjunctiva/sclera: Conjunctivae normal.     Pupils: Pupils are equal, round, and reactive to light.  Cardiovascular:     Rate and Rhythm: Normal rate and regular rhythm.     Pulses: Normal pulses.     Heart sounds: Normal heart sounds.  Pulmonary:     Effort: Pulmonary effort is normal.     Breath sounds: Normal breath sounds.  Chest:  Breasts:     Right: Normal. No swelling, bleeding, inverted nipple, mass or nipple discharge.     Left: Normal. No swelling, bleeding, inverted nipple, mass or nipple discharge.    Abdominal:     General: Abdomen is flat. Bowel sounds are normal.     Palpations: Abdomen is soft.  Genitourinary:    Prostate: Normal.     Rectum: Normal. Guaiac  result negative.  Musculoskeletal:        General: Normal range of motion.     Cervical back: Normal range of motion and neck supple.  Skin:    General: Skin is warm.  Neurological:     General: No focal deficit present.     Mental Status: He is alert.  Psychiatric:  Mood and Affect: Mood normal.        Behavior: Behavior normal.         Assessment And Plan:     1. Routine general medical examination at health care facility Comments: A full exam was performed. DRE performed, stool heme negative. PATIENT IS ADVISED TO GET 30-45 MINUTES REGULAR EXERCISE NO LESS THAN FOUR TO FIVE DAYS PER WEEK - BOTH WEIGHTBEARING EXERCISES AND AEROBIC ARE RECOMMENDED.  PATIENT IS ADVISED TO FOLLOW A HEALTHY DIET WITH AT LEAST SIX FRUITS/VEGGIES PER DAY, DECREASE INTAKE OF RED MEAT, AND TO INCREASE FISH INTAKE TO TWO DAYS PER WEEK.  MEATS/FISH SHOULD NOT BE FRIED, BAKED OR BROILED IS PREFERABLE.  IT IS ALSO IMPORTANT TO CUT BACK ON YOUR SUGAR INTAKE. PLEASE AVOID ANYTHING WITH ADDED SUGAR, CORN SYRUP OR OTHER SWEETENERS. IF YOU MUST USE A SWEETENER, YOU CAN TRY STEVIA. IT IS ALSO IMPORTANT TO AVOID ARTIFICIALLY SWEETENERS AND DIET BEVERAGES. LASTLY, I SUGGEST WEARING SPF 50 SUNSCREEN ON EXPOSED PARTS AND ESPECIALLY WHEN IN THE DIRECT SUNLIGHT FOR AN EXTENDED PERIOD OF TIME.  PLEASE AVOID FAST FOOD RESTAURANTS AND INCREASE YOUR WATER INTAKE.  - EKG 12-Lead - CMP14+EGFR - CBC - Lipid panel - POC Hemoccult Bld/Stl (1-Cd Office Dx)  2. Hypogonadism male Comments: I will check testosterone levels today.  He reports compliance with compounded testosterone. He will f/u in 4 months. - Testosterone,Free and Total  3. Pure hypercholesterolemia Comments: I will check lipid panel today. I will send refill of rosuvastatin. - Lipid panel - rosuvastatin (CRESTOR) 40 MG tablet; Take 1 tablet (40 mg total) by mouth at bedtime.  Dispense: 90 tablet; Refill: 2  4. Prediabetes Comments: I will check labs as  listed below. He will continue with bid dosing of metformin.  - Hemoglobin A1c - Insulin, random(561) - POCT Urinalysis Dipstick (81002)  5. Immunization due - Tdap (BOOSTRIX) injection 0.5 mL  Patient was given opportunity to ask questions. Patient verbalized understanding of the plan and was able to repeat key elements of the plan. All questions were answered to their satisfaction.   I, Maximino Greenland, MD, have reviewed all documentation for this visit. The documentation on 07/08/20 for the exam, diagnosis, procedures, and orders are all accurate and complete.   IF YOU HAVE BEEN REFERRED TO A SPECIALIST, IT MAY TAKE 1-2 WEEKS TO SCHEDULE/PROCESS THE REFERRAL. IF YOU HAVE NOT HEARD FROM US/SPECIALIST IN TWO WEEKS, PLEASE GIVE [place] A CALL AT [phone].   THE PATIENT IS ENCOURAGED TO PRACTICE SOCIAL DISTANCING DUE TO THE COVID-19 PANDEMIC.

## 2020-06-13 LAB — CBC
Hematocrit: 41.5 % (ref 37.5–51.0)
Hemoglobin: 12.9 g/dL — ABNORMAL LOW (ref 13.0–17.7)
MCH: 24.7 pg — ABNORMAL LOW (ref 26.6–33.0)
MCHC: 31.1 g/dL — ABNORMAL LOW (ref 31.5–35.7)
MCV: 79 fL (ref 79–97)
Platelets: 315 10*3/uL (ref 150–450)
RBC: 5.23 x10E6/uL (ref 4.14–5.80)
RDW: 12.7 % (ref 11.6–15.4)
WBC: 7.7 10*3/uL (ref 3.4–10.8)

## 2020-06-13 LAB — LIPID PANEL
Chol/HDL Ratio: 2.5 ratio (ref 0.0–5.0)
Cholesterol, Total: 96 mg/dL — ABNORMAL LOW (ref 100–199)
HDL: 38 mg/dL — ABNORMAL LOW (ref >39)
LDL Chol Calc (NIH): 35 mg/dL (ref 0–99)
Triglycerides: 129 mg/dL (ref 0–149)
VLDL Cholesterol Cal: 23 mg/dL (ref 5–40)

## 2020-06-13 LAB — CMP14+EGFR
ALT: 33 IU/L (ref 0–44)
AST: 26 IU/L (ref 0–40)
Albumin/Globulin Ratio: 1.7 (ref 1.2–2.2)
Albumin: 4.8 g/dL (ref 3.8–4.8)
Alkaline Phosphatase: 78 IU/L (ref 44–121)
BUN/Creatinine Ratio: 19 (ref 10–24)
BUN: 18 mg/dL (ref 8–27)
Bilirubin Total: 0.4 mg/dL (ref 0.0–1.2)
CO2: 28 mmol/L (ref 20–29)
Calcium: 10 mg/dL (ref 8.6–10.2)
Chloride: 99 mmol/L (ref 96–106)
Creatinine, Ser: 0.95 mg/dL (ref 0.76–1.27)
Globulin, Total: 2.9 g/dL (ref 1.5–4.5)
Glucose: 92 mg/dL (ref 65–99)
Potassium: 4.4 mmol/L (ref 3.5–5.2)
Sodium: 139 mmol/L (ref 134–144)
Total Protein: 7.7 g/dL (ref 6.0–8.5)
eGFR: 91 mL/min/{1.73_m2} (ref >59)

## 2020-06-13 LAB — INSULIN, RANDOM: INSULIN: 12 u[IU]/mL (ref 2.6–24.9)

## 2020-06-13 LAB — HEMOGLOBIN A1C
Est. average glucose Bld gHb Est-mCnc: 117 mg/dL
Hgb A1c MFr Bld: 5.7 % — ABNORMAL HIGH (ref 4.8–5.6)

## 2020-06-13 LAB — TESTOSTERONE,FREE AND TOTAL
Testosterone, Free: 6.2 pg/mL — ABNORMAL LOW (ref 6.6–18.1)
Testosterone: 597 ng/dL (ref 264–916)

## 2020-06-27 LAB — HM COLONOSCOPY

## 2020-06-29 ENCOUNTER — Encounter: Payer: Self-pay | Admitting: Internal Medicine

## 2020-07-02 ENCOUNTER — Encounter: Payer: Self-pay | Admitting: Internal Medicine

## 2020-07-03 ENCOUNTER — Other Ambulatory Visit: Payer: Self-pay | Admitting: Internal Medicine

## 2020-07-03 DIAGNOSIS — U071 COVID-19: Secondary | ICD-10-CM

## 2020-07-04 ENCOUNTER — Encounter: Payer: Self-pay | Admitting: Oncology

## 2020-07-04 ENCOUNTER — Other Ambulatory Visit (HOSPITAL_COMMUNITY): Payer: Self-pay

## 2020-07-04 ENCOUNTER — Telehealth: Payer: Self-pay | Admitting: Oncology

## 2020-07-04 ENCOUNTER — Telehealth: Payer: Self-pay | Admitting: Unknown Physician Specialty

## 2020-07-04 MED ORDER — NIRMATRELVIR/RITONAVIR (PAXLOVID)TABLET
3.0000 | ORAL_TABLET | Freq: Two times a day (BID) | ORAL | 0 refills | Status: AC
  Filled 2020-07-04: qty 30, 5d supply, fill #0

## 2020-07-04 NOTE — Telephone Encounter (Signed)
Called to discuss with patient about COVID-19 symptoms and the use of one of the available treatments for those with mild to moderate Covid symptoms and at a high risk of hospitalization.  Pt appears to qualify for outpatient treatment due to co-morbid conditions and/or a member of an at-risk group in accordance with the FDA Emergency Use Authorization.    Symptom onset: 07/02/20 Vaccinated: Yes Booster? Yes Immunocompromised? No  Past Medical History:  Diagnosis Date  . Arthritis   . High cholesterol   . Pre-diabetes    NIH Criteria:Tier 1  Unable to reach pt - Left VM and sent [facility].   Mauro Kaufmann

## 2020-07-04 NOTE — Telephone Encounter (Signed)
Outpatient Oral COVID Treatment Note  I connected with Devon Fowler on 07/04/2020/3:15 PM by telephone and verified that I am speaking with the correct person using two identifiers.  I discussed the limitations, risks, security, and privacy concerns of performing an evaluation and management service by telephone and the availability of in person appointments. I also discussed with the patient that there may be a patient responsible charge related to this service. The patient expressed understanding and agreed to proceed.  Patient location: home Provider location: home  Diagnosis: COVID-19 infection  Purpose of visit: Discussion of potential use of Molnupiravir or Paxlovid, a new treatment for mild to moderate COVID-19 viral infection in non-hospitalized patients.   Subjective: Patient is a 60 y.o. male who has been diagnosed with COVID 19 viral infection.  Their symptoms began on 5/22 with fever, cough.    Past Medical History:  Diagnosis Date  . Arthritis   . High cholesterol   . Pre-diabetes     Allergies  Allergen Reactions  . Lamisil [Terbinafine] Hives     Current Outpatient Medications:  .  Alpha-Lipoic Acid 600 MG CAPS, Take 600 mg by mouth daily., Disp: , Rfl:  .  ARGININE PO, Take 750 mg by mouth daily., Disp: , Rfl:  .  Ascorbic Acid (VITAMIN C) 1000 MG tablet, Take 1,000 mg by mouth daily., Disp: , Rfl:  .  aspirin EC 325 MG EC tablet, Take 1 tablet (325 mg total) by mouth 2 (two) times daily. Then take one 81 mg aspirin once a day for three weeks. Then discontinue aspirin., Disp: 42 tablet, Rfl: 0 .  Cholecalciferol (VITAMIN D-3) 5000 UNIT/ML LIQD, Place 5,000 Units under the tongue 2 (two) times a week., Disp: , Rfl:  .  Coenzyme Q10 (CO Q-10) 200 MG CAPS, Take 200 mg by mouth 2 (two) times daily., Disp: , Rfl:  .  Continuous Blood Gluc Sensor (FREESTYLE LIBRE 14 DAY SENSOR) MISC, USE TO CHECK BLOOD SUGAR FOUR TIMES A DAY AS DIRECTED, Disp: 6 each, Rfl: 2 .   dorzolamide-timolol (COSOPT) 22.3-6.8 MG/ML ophthalmic solution, Place 1 drop into both eyes 2 (two) times daily., Disp: , Rfl:  .  ezetimibe (ZETIA) 10 MG tablet, TAKE ONE TABLET BY MOUTH DAILY, Disp: 90 tablet, Rfl: 0 .  finasteride (PROSCAR) 5 MG tablet, Take 2.5 mg by mouth daily., Disp: , Rfl:  .  GLUTATHIONE PO, Take 250 mg by mouth daily., Disp: , Rfl:  .  latanoprost (XALATAN) 0.005 % ophthalmic solution, Place 1 drop into both eyes at bedtime., Disp: , Rfl:  .  Menaquinone-7 (VITAMIN K2 PO), Take 1 tablet by mouth daily., Disp: , Rfl:  .  metFORMIN (GLUCOPHAGE) 850 MG tablet, Take 1 tablet (850 mg total) by mouth 2 (two) times daily with a meal., Disp: 180 tablet, Rfl: 3 .  minoxidil (LONITEN) 2.5 MG tablet, Take 0.625 mg by mouth daily., Disp: , Rfl:  .  Multiple Vitamin (MULTIVITAMIN) tablet, Take 1 tablet by mouth daily., Disp: , Rfl:  .  omega-3 acid ethyl esters (LOVAZA) 1 g capsule, Take 1 g by mouth daily., Disp: , Rfl:  .  Resveratrol 250 MG CAPS, Take 250 mg by mouth at bedtime., Disp: , Rfl:  .  rosuvastatin (CRESTOR) 40 MG tablet, Take 1 tablet (40 mg total) by mouth at bedtime., Disp: 90 tablet, Rfl: 2 .  Testosterone 5.5 MG/ACT GEL, Apply 1 application topically 2 (two) times a week., Disp: , Rfl:  .  vitamin E  180 MG (400 UNITS) capsule, Take 400 Units by mouth every morning., Disp: , Rfl:   Objective: Patient appears/sounds well.  They are in no apparent distress.  Breathing is non labored.  Mood and behavior are normal.  Laboratory Data:  Recent Results (from the past 2160 hour(s))  Surgical pcr screen     Status: None   Collection Time: 05/11/20  8:33 AM   Specimen: Nasal Mucosa; Nasal Swab  Result Value Ref Range   MRSA, PCR NEGATIVE NEGATIVE   Staphylococcus aureus NEGATIVE NEGATIVE    Comment: (NOTE) The Xpert SA Assay (FDA approved for NASAL specimens in patients 75 years of age and older), is one component of a comprehensive surveillance program. It is not  intended to diagnose infection nor to guide or monitor treatment. Performed at [facility], [place] [address], [place], [place] [address]   Hemoglobin A1c per protocol     Status: Abnormal   Collection Time: 05/11/20  8:33 AM  Result Value Ref Range   Hgb A1c MFr Bld 6.0 (H) 4.8 - 5.6 %    Comment: (NOTE) Pre diabetes:          5.7%-6.4%  Diabetes:              >6.4%  Glycemic control for   <7.0% adults with diabetes    Mean Plasma Glucose 125.5 mg/dL    Comment: Performed at [facility] [address] [place], [place] [address]  CBC     Status: Abnormal   Collection Time: 05/11/20  8:33 AM  Result Value Ref Range   WBC 6.0 4.0 - 10.5 K/uL   RBC 5.46 4.22 - 5.81 MIL/uL   Hemoglobin 14.0 13.0 - 17.0 g/dL   HCT 45.1 39.0 - 52.0 %   MCV 82.6 80.0 - 100.0 fL   MCH 25.6 (L) 26.0 - 34.0 pg   MCHC 31.0 30.0 - 36.0 g/dL   RDW 13.5 11.5 - 15.5 %   Platelets 213 150 - 400 K/uL   nRBC 0.0 0.0 - 0.2 %    Comment: Performed at [facility], [place] [address] [place], [place] [address]  Comprehensive metabolic panel     Status: Abnormal   Collection Time: 05/11/20  8:33 AM  Result Value Ref Range   Sodium 140 135 - 145 mmol/L   Potassium 3.9 3.5 - 5.1 mmol/L   Chloride 105 98 - 111 mmol/L   CO2 28 22 - 32 mmol/L   Glucose, Bld 110 (H) 70 - 99 mg/dL    Comment: Glucose reference range applies only to samples taken after fasting for at least 8 hours.   BUN 17 8 - 23 mg/dL   Creatinine, Ser 0.81 0.61 - 1.24 mg/dL   Calcium 9.6 8.9 - 10.3 mg/dL   Total Protein 7.7 6.5 - 8.1 g/dL   Albumin 4.5 3.5 - 5.0 g/dL   AST 44 (H) 15 - 41 U/L   ALT 52 (H) 0 - 44 U/L   Alkaline Phosphatase 35 (L) 38 - 126 U/L   Total Bilirubin 0.7 0.3 - 1.2 mg/dL   GFR, Estimated >60 >60 mL/min    Comment: (NOTE) Calculated using the CKD-EPI Creatinine Equation (2021)    Anion gap 7 5 - 15    Comment: Performed at [facility], [place] [address], [place], [place] [address]  Protime-INR     Status: None   Collection Time: 05/11/20  8:33 AM  Result  Value Ref Range   Prothrombin Time 13.6 11.4 - 15.2 seconds   INR 1.1 0.8 - 1.2    Comment: (NOTE) INR goal varies based on device and disease states. Performed at [facility], [place] [address], [place], [place] [address]   APTT     Status: None   Collection Time: 05/11/20  8:33 AM  Result Value Ref Range   aPTT 28 24 - 36 seconds    Comment: Performed at [facility], [place] [address], [place], [place] [address]  Type and screen Order type and screen if day of surgery is less than 15 days from draw of preadmission visit or order morning of surgery if day of surgery is greater than 6 days from preadmission visit.     Status: None   Collection Time: 05/11/20  8:33 AM  Result Value Ref Range   ABO/RH(D) B NEG    Antibody Screen NEG    Sample Expiration 05/19/2020,2359    Extend sample reason      NO TRANSFUSIONS OR PREGNANCY IN THE PAST 3 MONTHS Performed at [facility], [place] [address] [place], [place] [address]   SARS CORONAVIRUS 2 (TAT 6-24 HRS) Nasopharyngeal Nasopharyngeal Swab     Status: None   Collection Time: 05/14/20  8:37 AM   Specimen: Nasopharyngeal Swab  Result Value Ref Range   SARS Coronavirus 2 NEGATIVE NEGATIVE    Comment: (NOTE) SARS-CoV-2 target nucleic acids are NOT DETECTED.  The SARS-CoV-2 RNA is generally detectable in upper and lower respiratory specimens during the acute phase of infection. Negative results do not preclude SARS-CoV-2 infection, do not rule out co-infections with other pathogens, and should not be used as the sole basis for treatment or other patient management decisions. Negative results must be combined with clinical observations, patient history, and epidemiological information. The expected result is Negative.  Fact Sheet for  Patients: SugarRoll.be  Fact Sheet for Healthcare Providers: https://www.woods-mathews.com/  This test is not yet approved or cleared by the [place] FDA and  has been authorized for detection and/or diagnosis of SARS-CoV-2 by FDA under an Emergency Use Authorization (EUA). This EUA will remain  in effect (meaning this test can be used) for the duration of the COVID-19 declaration under Se ction 564(b)(1) of the Act, 21 U.S.C. section 360bbb-3(b)(1), unless the authorization is terminated or revoked sooner.  Performed at [facility], [place] [address], [place], [place] [address]   Glucose, capillary     Status: Abnormal   Collection Time: 05/16/20  7:04 AM  Result Value Ref Range   Glucose-Capillary 102 (H) 70 - 99 mg/dL    Comment: Glucose reference range applies only to samples taken after fasting for at least 8 hours.   Comment 1 Notify RN    Comment 2 Document in Chart   ABO/Rh     Status: None   Collection Time: 05/16/20  7:20 AM  Result Value Ref Range   ABO/RH(D)      B NEG Performed at [facility], [place] [address] [place], [place] [address]   Glucose, capillary     Status: Abnormal   Collection Time: 05/16/20  4:15 PM  Result Value Ref Range   Glucose-Capillary 168 (H) 70 - 99 mg/dL    Comment: Glucose reference range applies only to samples taken after fasting for at least 8 hours.  Glucose, capillary     Status: Abnormal   Collection Time: 05/16/20  9:17  PM  Result Value Ref Range   Glucose-Capillary 146 (H) 70 - 99 mg/dL    Comment: Glucose reference range applies only to samples taken after fasting for at least 8 hours.  CBC     Status: Abnormal   Collection Time: 05/17/20  3:04 AM  Result Value Ref Range   WBC 14.3 (H) 4.0 - 10.5 K/uL   RBC 4.36 4.22 - 5.81 MIL/uL   Hemoglobin 11.3 (L) 13.0 - 17.0 g/dL   HCT 35.8 (L) 39.0 - 52.0 %   MCV 82.1 80.0 - 100.0 fL   MCH 25.9 (L) 26.0 - 34.0 pg    MCHC 31.6 30.0 - 36.0 g/dL   RDW 13.2 11.5 - 15.5 %   Platelets 176 150 - 400 K/uL   nRBC 0.0 0.0 - 0.2 %    Comment: Performed at [facility], [place] [address], [place], [place] [address]  Basic metabolic panel     Status: Abnormal   Collection Time: 05/17/20  3:04 AM  Result Value Ref Range   Sodium 138 135 - 145 mmol/L   Potassium 4.1 3.5 - 5.1 mmol/L   Chloride 102 98 - 111 mmol/L   CO2 27 22 - 32 mmol/L   Glucose, Bld 141 (H) 70 - 99 mg/dL    Comment: Glucose reference range applies only to samples taken after fasting for at least 8 hours.   BUN 24 (H) 8 - 23 mg/dL   Creatinine, Ser 0.80 0.61 - 1.24 mg/dL   Calcium 9.3 8.9 - 10.3 mg/dL   GFR, Estimated >60 >60 mL/min    Comment: (NOTE) Calculated using the CKD-EPI Creatinine Equation (2021)    Anion gap 9 5 - 15    Comment: Performed at [facility], [place] [address] [place], [place] [address]  CMP14+EGFR     Status: None   Collection Time: 06/11/20  3:19 PM  Result Value Ref Range   Glucose 92 65 - 99 mg/dL   BUN 18 8 - 27 mg/dL   Creatinine, Ser 0.95 0.76 - 1.27 mg/dL   eGFR 91 >59 mL/min/1.73   BUN/Creatinine Ratio 19 10 - 24   Sodium 139 134 - 144 mmol/L   Potassium 4.4 3.5 - 5.2 mmol/L   Chloride 99 96 - 106 mmol/L   CO2 28 20 - 29 mmol/L   Calcium 10.0 8.6 - 10.2 mg/dL   Total Protein 7.7 6.0 - 8.5 g/dL   Albumin 4.8 3.8 - 4.8 g/dL   Globulin, Total 2.9 1.5 - 4.5 g/dL   Albumin/Globulin Ratio 1.7 1.2 - 2.2   Bilirubin Total 0.4 0.0 - 1.2 mg/dL   Alkaline Phosphatase 78 44 - 121 IU/L   AST 26 0 - 40 IU/L   ALT 33 0 - 44 IU/L  CBC     Status: Abnormal   Collection Time: 06/11/20  3:19 PM  Result Value Ref Range   WBC 7.7 3.4 - 10.8 x10E3/uL   RBC 5.23 4.14 - 5.80 x10E6/uL   Hemoglobin 12.9 (L) 13.0 - 17.7 g/dL   Hematocrit 41.5 37.5 - 51.0 %   MCV 79 79 - 97 fL   MCH 24.7 (L) 26.6 - 33.0 pg   MCHC 31.1 (L) 31.5 - 35.7 g/dL   RDW 12.7 11.6 - 15.4 %   Platelets 315  150 - 450 x10E3/uL  Lipid panel     Status: Abnormal   Collection Time: 06/11/20  3:19 PM  Result Value Ref Range  Cholesterol, Total 96 (L) 100 - 199 mg/dL   Triglycerides 129 0 - 149 mg/dL   HDL 38 (L) >39 mg/dL   VLDL Cholesterol Cal 23 5 - 40 mg/dL   LDL Chol Calc (NIH) 35 0 - 99 mg/dL   Chol/HDL Ratio 2.5 0.0 - 5.0 ratio    Comment:                                   T. Chol/HDL Ratio                                             Men  Women                               1/2 Avg.Risk  3.4    3.3                                   Avg.Risk  5.0    4.4                                2X Avg.Risk  9.6    7.1                                3X Avg.Risk 23.4   11.0   Hemoglobin A1c     Status: Abnormal   Collection Time: 06/11/20  3:19 PM  Result Value Ref Range   Hgb A1c MFr Bld 5.7 (H) 4.8 - 5.6 %    Comment:          Prediabetes: 5.7 - 6.4          Diabetes: >6.4          Glycemic control for adults with diabetes: <7.0    Est. average glucose Bld gHb Est-mCnc 117 mg/dL  Insulin, random(561)     Status: None   Collection Time: 06/11/20  3:19 PM  Result Value Ref Range   INSULIN 12.0 2.6 - 24.9 uIU/mL  Testosterone,Free and Total     Status: Abnormal   Collection Time: 06/11/20  3:19 PM  Result Value Ref Range   Testosterone 597 264 - 916 ng/dL    Comment: Adult male reference interval is based on a population of healthy nonobese males (BMI <30) between 42 and 69 years old. [place], [place] [phone]. PMID: [id number].    Testosterone, Free 6.2 (L) 6.6 - 18.1 pg/mL  POCT Urinalysis Dipstick (71245)     Status: Normal   Collection Time: 06/11/20  3:32 PM  Result Value Ref Range   Color, UA yellow    Clarity, UA clear    Glucose, UA Negative Negative   Bilirubin, UA negative    Ketones, UA negative    Spec Grav, UA 1.015 1.010 - 1.025   Blood, UA negative    pH, UA 7.0 5.0 - 8.0   Protein, UA Negative Negative   Urobilinogen, UA 0.2 0.2 or 1.0 E.U./dL    Nitrite, UA negative    Leukocytes, UA Negative Negative   Appearance  Odor    HM COLONOSCOPY     Status: None   Collection Time: 06/27/20 12:00 AM  Result Value Ref Range   HM Colonoscopy See Report (in chart) See Report (in chart), Patient Reported     Assessment: 60 y.o. male with mild/moderate COVID 19 viral infection diagnosed on 5/22 at high risk for progression to severe COVID 19.  Plan:  This patient is a 60 y.o. male that meets the following criteria for Emergency Use Authorization of: Paxlovid 1. Age >12 yr AND > 40 kg 2. SARS-COV-2 positive test 3. Symptom onset < 5 days 4. Mild-to-moderate COVID disease with high risk for severe progression to hospitalization or death  I have spoken and communicated the following to the patient or parent/caregiver regarding: 1. Paxlovid is an unapproved drug that is authorized for use under an Emergency Use Authorization.  2. There are no adequate, approved, available products for the treatment of COVID-19 in adults who have mild-to-moderate COVID-19 and are at high risk for progressing to severe COVID-19, including hospitalization or death. 3. Other therapeutics are currently authorized. For additional information on all products authorized for treatment or prevention of COVID-19, please see TanEmporium.pl.  4. There are benefits and risks of taking this treatment as outlined in the "Fact Sheet for Patients and Caregivers."  5. "Fact Sheet for Patients and Caregivers" was reviewed with patient. A hard copy will be provided to patient from pharmacy prior to the patient receiving treatment. 6. Patients should continue to self-isolate and use infection control measures (e.g., wear mask, isolate, social distance, avoid sharing personal items, clean and disinfect "high touch" surfaces, and frequent handwashing) according to CDC guidelines.   7. The patient or parent/caregiver has the option to accept or refuse treatment. 8. Patient medication history was reviewed for potential drug interactions:Interaction with home meds: Crestor will stop during tx 9. Patient's GFR was calculated to be >90, and they were therefore prescribed Normal dose (GFR>60) - nirmatrelvir 130m tab (2 tablet) by mouth twice daily AND ritonavir 1059mtab (1 tablet) by mouth twice daily   After reviewing above information with the patient, the patient agrees to receive Paxlovid.  Follow up instructions:    . Take prescription BID x 5 days as directed . Reach out to pharmacist for counseling on medication if desired . For concerns regarding further COVID symptoms please follow up with your PCP or urgent care . For urgent or life-threatening issues, seek care at your local emergency department  The patient was provided an opportunity to ask questions, and all were answered. The patient agreed with the plan and demonstrated an understanding of the instructions.   [facility]  The patient was advised to call their PCP or seek an in-person evaluation if the symptoms worsen or if the condition fails to improve as anticipated.   I provided 15 minutes of non face-to-face telephone visit time during this encounter, and > 50% was spent counseling as documented under my assessment & plan.  ChKathrine HaddockNP 07/04/2020 /3:15 PM

## 2020-07-09 LAB — POC HEMOCCULT BLD/STL (OFFICE/1-CARD/DIAGNOSTIC): Fecal Occult Blood, POC: NEGATIVE

## 2020-07-20 ENCOUNTER — Other Ambulatory Visit: Payer: Self-pay | Admitting: Internal Medicine

## 2020-08-31 ENCOUNTER — Other Ambulatory Visit (HOSPITAL_COMMUNITY): Payer: Self-pay

## 2020-12-13 ENCOUNTER — Ambulatory Visit (INDEPENDENT_AMBULATORY_CARE_PROVIDER_SITE_OTHER): Payer: 59 | Admitting: Internal Medicine

## 2020-12-13 ENCOUNTER — Encounter: Payer: Self-pay | Admitting: Internal Medicine

## 2020-12-13 ENCOUNTER — Other Ambulatory Visit: Payer: Self-pay

## 2020-12-13 VITALS — BP 122/80 | HR 50 | Temp 97.7°F | Ht 72.0 in | Wt 183.9 lb

## 2020-12-13 DIAGNOSIS — R7303 Prediabetes: Secondary | ICD-10-CM

## 2020-12-13 DIAGNOSIS — E291 Testicular hypofunction: Secondary | ICD-10-CM

## 2020-12-13 DIAGNOSIS — E7800 Pure hypercholesterolemia, unspecified: Secondary | ICD-10-CM

## 2020-12-13 DIAGNOSIS — Z79899 Other long term (current) drug therapy: Secondary | ICD-10-CM | POA: Diagnosis not present

## 2020-12-13 MED ORDER — ROSUVASTATIN CALCIUM 40 MG PO TABS: 40.0000 mg | ORAL_TABLET | Freq: Every day | ORAL | 2 refills | Status: DC

## 2020-12-13 MED ORDER — SILDENAFIL CITRATE 50 MG PO TABS: 50.0000 mg | ORAL_TABLET | Freq: Every day | ORAL | 1 refills | Status: DC | PRN

## 2020-12-13 MED ORDER — EZETIMIBE 10 MG PO TABS: 10.0000 mg | ORAL_TABLET | Freq: Every day | ORAL | 2 refills | Status: DC

## 2020-12-13 NOTE — Progress Notes (Signed)
I,Katawbba Wiggins,acting as a Education administrator for Maximino Greenland, MD.,have documented all relevant documentation on the behalf of Maximino Greenland, MD,as directed by  Maximino Greenland, MD while in the presence of Maximino Greenland, MD.  This visit occurred during the SARS-CoV-2 public health emergency.  Safety protocols were in place, including screening questions prior to the visit, additional usage of staff PPE, and extensive cleaning of exam room while observing appropriate contact time as indicated for disinfecting solutions.  Subjective:     Patient ID: Devon Fowler , male    DOB: [birth year] , 60 y.o.   MRN: [id number]   Chief Complaint  Patient presents with   Hyperlipidemia    HPI  Pt here for chol f/u. He is currently taking rosuvastatin,does have occasional myalgias. Also on CoQ10 supplementation. No new concerns.   Hyperlipidemia This is a chronic problem. The current episode started more than 1 year ago. The problem is controlled. Pertinent negatives include no chest pain, focal sensory loss or focal weakness. Current antihyperlipidemic treatment includes statins, exercise and diet change. The current treatment provides moderate improvement of lipids. There are no compliance problems.  Risk factors for coronary artery disease include male sex.    Past Medical History:  Diagnosis Date   Arthritis    High cholesterol    Pre-diabetes      Family History  Problem Relation Age of Onset   Diabetes Mother    Heart attack Father      Current Outpatient Medications:    Alpha-Lipoic Acid 600 MG CAPS, Take 600 mg by mouth daily., Disp: , Rfl:    Ascorbic Acid (VITAMIN C) 1000 MG tablet, Take 1,000 mg by mouth daily., Disp: , Rfl:    aspirin EC 325 MG EC tablet, Take 1 tablet (325 mg total) by mouth 2 (two) times daily. Then take one 81 mg aspirin once a day for three weeks. Then discontinue aspirin., Disp: 42 tablet, Rfl: 0   Cholecalciferol (VITAMIN D-3) 5000 UNIT/ML LIQD, Place 5,000  Units under the tongue 2 (two) times a week., Disp: , Rfl:    Coenzyme Q10 (CO Q-10) 200 MG CAPS, Take 200 mg by mouth 2 (two) times daily., Disp: , Rfl:    Continuous Blood Gluc Sensor (FREESTYLE LIBRE 14 DAY SENSOR) MISC, USE TO CHECK BLOOD SUGAR FOUR TIMES A DAY AS DIRECTED, Disp: 6 each, Rfl: 2   dorzolamide-timolol (COSOPT) 22.3-6.8 MG/ML ophthalmic solution, Place 1 drop into both eyes 2 (two) times daily., Disp: , Rfl:    finasteride (PROSCAR) 5 MG tablet, Take 2.5 mg by mouth daily., Disp: , Rfl:    GLUTATHIONE PO, Take 250 mg by mouth daily., Disp: , Rfl:    latanoprost (XALATAN) 0.005 % ophthalmic solution, Place 1 drop into both eyes at bedtime., Disp: , Rfl:    Menaquinone-7 (VITAMIN K2 PO), Take 1 tablet by mouth daily., Disp: , Rfl:    minoxidil (LONITEN) 2.5 MG tablet, Take 0.625 mg by mouth daily., Disp: , Rfl:    Multiple Vitamin (MULTIVITAMIN) tablet, Take 1 tablet by mouth daily., Disp: , Rfl:    omega-3 acid ethyl esters (LOVAZA) 1 g capsule, Take 1 g by mouth daily., Disp: , Rfl:    Resveratrol 250 MG CAPS, Take 250 mg by mouth at bedtime., Disp: , Rfl:    Testosterone 5.5 MG/ACT GEL, Apply 1 application topically 2 (two) times a week., Disp: , Rfl:    vitamin E 180 MG (400 UNITS) capsule, Take 400  Units by mouth every morning., Disp: , Rfl:    ARGININE PO, Take 750 mg by mouth daily. (Patient not taking: Reported on 12/13/2020), Disp: , Rfl:    ezetimibe (ZETIA) 10 MG tablet, Take 1 tablet (10 mg total) by mouth daily., Disp: 90 tablet, Rfl: 2   metFORMIN (GLUCOPHAGE) 850 MG tablet, Take 1 tablet (850 mg total) by mouth 2 (two) times daily with a meal., Disp: 180 tablet, Rfl: 3   rosuvastatin (CRESTOR) 40 MG tablet, Take 1 tablet (40 mg total) by mouth at bedtime., Disp: 90 tablet, Rfl: 2   sildenafil (VIAGRA) 50 MG tablet, Take 1 tablet (50 mg total) by mouth daily as needed for erectile dysfunction., Disp: 30 tablet, Rfl: 1   Allergies  Allergen Reactions   Lamisil  [Terbinafine] Hives     Review of Systems  Constitutional: Negative.   Respiratory: Negative.    Cardiovascular: Negative.  Negative for chest pain.  Gastrointestinal: Negative.   Neurological:  Negative for focal weakness.  Psychiatric/Behavioral: Negative.    All other systems reviewed and are negative.   Today's Vitals   12/13/20 1445  BP: 122/80  Pulse: (!) 50  Temp: 97.7 F (36.5 C)  Weight: 183 lb 14.4 oz (83.4 kg)  Height: 6' (1.829 m)   Body mass index is 24.94 kg/m.  Wt Readings from Last 3 Encounters:  12/13/20 183 lb 14.4 oz (83.4 kg)  06/11/20 171 lb (77.6 kg)  05/16/20 174 lb 6.1 oz (79.1 kg)    BP Readings from Last 3 Encounters:  12/13/20 122/80  06/11/20 116/76  05/17/20 112/66    Objective:  Physical Exam Vitals and nursing note reviewed.  Constitutional:      Appearance: Normal appearance.  HENT:     Head: Normocephalic and atraumatic.     Nose:     Comments: Masked      Mouth/Throat:     Comments: Masked  Eyes:     Extraocular Movements: Extraocular movements intact.  Cardiovascular:     Rate and Rhythm: Normal rate and regular rhythm.     Heart sounds: Normal heart sounds.  Pulmonary:     Effort: Pulmonary effort is normal.     Breath sounds: Normal breath sounds.  Musculoskeletal:     Cervical back: Normal range of motion.  Skin:    General: Skin is warm.  Neurological:     General: No focal deficit present.     Mental Status: He is alert.  Psychiatric:        Mood and Affect: Mood normal.        Assessment And Plan:     1. Pure hypercholesterolemia Comments: Chronic, he prefers to ve labs drawn when fasting. He agrees to return for a lab visit. He will f/u in six months for re-evaluation.  - Lipid panel; Future - CMP14+EGFR; Future - rosuvastatin (CRESTOR) 40 MG tablet; Take 1 tablet (40 mg total) by mouth at bedtime.  Dispense: 90 tablet; Refill: 2 - TSH; Future - T4, free; Future - Thyroid Peroxidase Antibody; Future -  T3, free; Future  2. Prediabetes Comments: Chronic, I will check labs as listed below. I will make further recommendations once his labs are avail for review.  - Insulin, random(561); Future - Hemoglobin A1c; Future  3. Hypogonadism male Comments: Chronic, currently on compounded topical testosterone supplementation. I will check labs as listed below.  - Testosterone,Free and Total; Future - CBC no Diff; Future  4. Drug therapy - CBC no Diff; Future  Patient was given opportunity to ask questions. Patient verbalized understanding of the plan and was able to repeat key elements of the plan. All questions were answered to their satisfaction.   I, Maximino Greenland, MD, have reviewed all documentation for this visit. The documentation on 12/13/20 for the exam, diagnosis, procedures, and orders are all accurate and complete.  IF YOU HAVE BEEN REFERRED TO A SPECIALIST, IT MAY TAKE 1-2 WEEKS TO SCHEDULE/PROCESS THE REFERRAL. IF YOU HAVE NOT HEARD FROM US/SPECIALIST IN TWO WEEKS, PLEASE GIVE [place] A CALL AT [phone].   THE PATIENT IS ENCOURAGED TO PRACTICE SOCIAL DISTANCING DUE TO THE COVID-19 PANDEMIC.

## 2020-12-13 NOTE — Patient Instructions (Signed)

## 2020-12-18 ENCOUNTER — Encounter: Payer: Self-pay | Admitting: Internal Medicine

## 2020-12-19 ENCOUNTER — Encounter: Payer: Self-pay | Admitting: Internal Medicine

## 2020-12-19 ENCOUNTER — Other Ambulatory Visit: Payer: Self-pay

## 2020-12-19 DIAGNOSIS — E7800 Pure hypercholesterolemia, unspecified: Secondary | ICD-10-CM

## 2020-12-20 ENCOUNTER — Other Ambulatory Visit: Payer: Self-pay

## 2020-12-20 DIAGNOSIS — R7303 Prediabetes: Secondary | ICD-10-CM

## 2020-12-20 MED ORDER — METFORMIN HCL 850 MG PO TABS: 850.0000 mg | ORAL_TABLET | Freq: Two times a day (BID) | ORAL | 3 refills | Status: DC

## 2021-01-10 ENCOUNTER — Other Ambulatory Visit: Payer: 59

## 2021-01-10 ENCOUNTER — Other Ambulatory Visit: Payer: Self-pay

## 2021-01-10 DIAGNOSIS — Z79899 Other long term (current) drug therapy: Secondary | ICD-10-CM

## 2021-01-10 DIAGNOSIS — E291 Testicular hypofunction: Secondary | ICD-10-CM

## 2021-01-10 DIAGNOSIS — R7303 Prediabetes: Secondary | ICD-10-CM

## 2021-01-10 DIAGNOSIS — E7800 Pure hypercholesterolemia, unspecified: Secondary | ICD-10-CM

## 2021-01-11 LAB — THYROID PEROXIDASE ANTIBODY: Thyroperoxidase Ab SerPl-aCnc: 15 IU/mL (ref 0–34)

## 2021-01-11 LAB — CBC
Hematocrit: 47.7 % (ref 37.5–51.0)
Hemoglobin: 15 g/dL (ref 13.0–17.7)
MCH: 24.8 pg — ABNORMAL LOW (ref 26.6–33.0)
MCHC: 31.4 g/dL — ABNORMAL LOW (ref 31.5–35.7)
MCV: 79 fL (ref 79–97)
Platelets: 196 10*3/uL (ref 150–450)
RBC: 6.06 x10E6/uL — ABNORMAL HIGH (ref 4.14–5.80)
RDW: 13.7 % (ref 11.6–15.4)
WBC: 5.6 10*3/uL (ref 3.4–10.8)

## 2021-01-11 LAB — CMP14+EGFR
ALT: 42 IU/L (ref 0–44)
AST: 38 IU/L (ref 0–40)
Albumin/Globulin Ratio: 1.8 (ref 1.2–2.2)
Albumin: 4.9 g/dL — ABNORMAL HIGH (ref 3.8–4.8)
Alkaline Phosphatase: 51 IU/L (ref 44–121)
BUN/Creatinine Ratio: 16 (ref 10–24)
BUN: 17 mg/dL (ref 8–27)
Bilirubin Total: 0.5 mg/dL (ref 0.0–1.2)
CO2: 26 mmol/L (ref 20–29)
Calcium: 9.9 mg/dL (ref 8.6–10.2)
Chloride: 99 mmol/L (ref 96–106)
Creatinine, Ser: 1.06 mg/dL (ref 0.76–1.27)
Globulin, Total: 2.8 g/dL (ref 1.5–4.5)
Glucose: 89 mg/dL (ref 70–99)
Potassium: 4.4 mmol/L (ref 3.5–5.2)
Sodium: 139 mmol/L (ref 134–144)
Total Protein: 7.7 g/dL (ref 6.0–8.5)
eGFR: 79 mL/min/{1.73_m2} (ref >59)

## 2021-01-11 LAB — LIPID PANEL
Chol/HDL Ratio: 3.1 ratio (ref 0.0–5.0)
Cholesterol, Total: 104 mg/dL (ref 100–199)
HDL: 34 mg/dL — ABNORMAL LOW (ref >39)
LDL Chol Calc (NIH): 53 mg/dL (ref 0–99)
Triglycerides: 88 mg/dL (ref 0–149)
VLDL Cholesterol Cal: 17 mg/dL (ref 5–40)

## 2021-01-11 LAB — HEMOGLOBIN A1C
Est. average glucose Bld gHb Est-mCnc: 120 mg/dL
Hgb A1c MFr Bld: 5.8 % — ABNORMAL HIGH (ref 4.8–5.6)

## 2021-01-11 LAB — APOLIPOPROTEIN B: Apolipoprotein B: 65 mg/dL (ref <90)

## 2021-01-11 LAB — T3, FREE: T3, Free: 2.9 pg/mL (ref 2.0–4.4)

## 2021-01-11 LAB — TSH: TSH: 3.6 u[IU]/mL (ref 0.450–4.500)

## 2021-01-11 LAB — T4, FREE: Free T4: 1.26 ng/dL (ref 0.82–1.77)

## 2021-01-11 LAB — INSULIN, RANDOM: INSULIN: 7.8 u[IU]/mL (ref 2.6–24.9)

## 2021-01-11 LAB — TESTOSTERONE,FREE AND TOTAL
Testosterone, Free: 7.8 pg/mL (ref 6.6–18.1)
Testosterone: 697 ng/dL (ref 264–916)

## 2021-01-17 ENCOUNTER — Other Ambulatory Visit: Payer: 59

## 2021-01-18 ENCOUNTER — Other Ambulatory Visit: Payer: Self-pay

## 2021-01-18 ENCOUNTER — Encounter: Payer: Self-pay | Admitting: Internal Medicine

## 2021-01-18 MED ORDER — MINOXIDIL 2.5 MG PO TABS: 2.5000 mg | ORAL_TABLET | Freq: Every day | ORAL | 3 refills | Status: DC

## 2021-01-18 MED ORDER — FINASTERIDE 5 MG PO TABS: 2.5000 mg | ORAL_TABLET | Freq: Every day | ORAL | 3 refills | Status: DC

## 2021-03-31 ENCOUNTER — Other Ambulatory Visit: Payer: Self-pay | Admitting: Internal Medicine

## 2021-05-24 DIAGNOSIS — H401112 Primary open-angle glaucoma, right eye, moderate stage: Secondary | ICD-10-CM | POA: Diagnosis not present

## 2021-06-13 ENCOUNTER — Ambulatory Visit (INDEPENDENT_AMBULATORY_CARE_PROVIDER_SITE_OTHER): Payer: BC Managed Care – PPO | Admitting: Internal Medicine

## 2021-06-13 ENCOUNTER — Encounter: Payer: Self-pay | Admitting: Internal Medicine

## 2021-06-13 VITALS — BP 112/78 | HR 54 | Temp 97.6°F | Ht 71.2 in | Wt 182.8 lb

## 2021-06-13 DIAGNOSIS — Z0000 Encounter for general adult medical examination without abnormal findings: Secondary | ICD-10-CM

## 2021-06-13 DIAGNOSIS — E291 Testicular hypofunction: Secondary | ICD-10-CM | POA: Diagnosis not present

## 2021-06-13 DIAGNOSIS — R946 Abnormal results of thyroid function studies: Secondary | ICD-10-CM

## 2021-06-13 DIAGNOSIS — E559 Vitamin D deficiency, unspecified: Secondary | ICD-10-CM

## 2021-06-13 DIAGNOSIS — R7303 Prediabetes: Secondary | ICD-10-CM

## 2021-06-13 DIAGNOSIS — Z6825 Body mass index (BMI) 25.0-25.9, adult: Secondary | ICD-10-CM

## 2021-06-13 DIAGNOSIS — E7800 Pure hypercholesterolemia, unspecified: Secondary | ICD-10-CM

## 2021-06-13 DIAGNOSIS — E785 Hyperlipidemia, unspecified: Secondary | ICD-10-CM | POA: Diagnosis not present

## 2021-06-13 DIAGNOSIS — B351 Tinea unguium: Secondary | ICD-10-CM | POA: Diagnosis not present

## 2021-06-13 LAB — LIPID PANEL
Cholesterol: 98 (ref 0–200)
HDL: 38 (ref 35–70)
Triglycerides: 67 (ref 40–160)

## 2021-06-13 LAB — POC HEMOCCULT BLD/STL (OFFICE/1-CARD/DIAGNOSTIC)
Card #1 Date: 5042023
Fecal Occult Blood, POC: NEGATIVE

## 2021-06-13 MED ORDER — FLUCONAZOLE 200 MG PO TABS: ORAL_TABLET | ORAL | 0 refills | Status: DC

## 2021-06-13 NOTE — Progress Notes (Signed)
Rich Brave Llittleton,acting as a Education administrator for Maximino Greenland, MD.,have documented all relevant documentation on the behalf of Maximino Greenland, MD,as directed by  Maximino Greenland, MD while in the presence of Maximino Greenland, MD.  This visit occurred during the SARS-CoV-2 public health emergency.  Safety protocols were in place, including screening questions prior to the visit, additional usage of staff PPE, and extensive cleaning of exam room while observing appropriate contact time as indicated for disinfecting solutions.  Subjective:     Patient ID: Devon Fowler , male    DOB: [birth year] , 61 y.o.   MRN: [id number]   Chief Complaint  Patient presents with   Annual Exam    HPI  He presents today for a full physical examination. He has no specific concerns or complaints at this time. He reports compliance with meds. His wife is functional medicine specialist and he's following eating plan/lifestyle that hopefully results in low levels of inflammation and insulin resistance.     Past Medical History:  Diagnosis Date   Arthritis    High cholesterol    Pre-diabetes      Family History  Problem Relation Age of Onset   Diabetes Mother    Heart attack Father      Current Outpatient Medications:    Alpha-Lipoic Acid 600 MG CAPS, Take 600 mg by mouth daily., Disp: , Rfl:    Ascorbic Acid (VITAMIN C) 1000 MG tablet, Take 1,000 mg by mouth daily., Disp: , Rfl:    Cholecalciferol (VITAMIN D-3) 5000 UNIT/ML LIQD, Place 5,000 Units under the tongue 2 (two) times a week., Disp: , Rfl:    Coenzyme Q10 (CO Q-10) 200 MG CAPS, Take 200 mg by mouth 2 (two) times daily., Disp: , Rfl:    Continuous Blood Gluc Sensor (FREESTYLE LIBRE 14 DAY SENSOR) MISC, USE TO CHECK BLOOD SUGAR FOUR TIMES A DAY AS DIRECTED, Disp: 6 each, Rfl: 2   dorzolamide-timolol (COSOPT) 22.3-6.8 MG/ML ophthalmic solution, Place 1 drop into both eyes 2 (two) times daily., Disp: , Rfl:    ezetimibe (ZETIA) 10 MG tablet, Take 1  tablet (10 mg total) by mouth daily., Disp: 90 tablet, Rfl: 2   finasteride (PROSCAR) 5 MG tablet, Take 0.5 tablets (2.5 mg total) by mouth daily., Disp: 90 tablet, Rfl: 3   GLUTATHIONE PO, Take 250 mg by mouth daily., Disp: , Rfl:    latanoprost (XALATAN) 0.005 % ophthalmic solution, Place 1 drop into both eyes at bedtime., Disp: , Rfl:    metFORMIN (GLUCOPHAGE) 850 MG tablet, Take 1 tablet (850 mg total) by mouth 2 (two) times daily with a meal., Disp: 180 tablet, Rfl: 3   minoxidil (LONITEN) 2.5 MG tablet, Take 1 tablet (2.5 mg total) by mouth daily., Disp: 90 tablet, Rfl: 3   Multiple Vitamin (MULTIVITAMIN) tablet, Take 1 tablet by mouth daily., Disp: , Rfl:    omega-3 acid ethyl esters (LOVAZA) 1 g capsule, Take 1 g by mouth daily., Disp: , Rfl:    Resveratrol 250 MG CAPS, Take 250 mg by mouth at bedtime., Disp: , Rfl:    rosuvastatin (CRESTOR) 40 MG tablet, Take 1 tablet (40 mg total) by mouth at bedtime., Disp: 90 tablet, Rfl: 2   sildenafil (VIAGRA) 50 MG tablet, Take 1 tablet (50 mg total) by mouth daily as needed for erectile dysfunction., Disp: 30 tablet, Rfl: 1   Testosterone 5.5 MG/ACT GEL, Apply 1 application topically 2 (two) times a week., Disp: , Rfl:  vitamin E 180 MG (400 UNITS) capsule, Take 400 Units by mouth every morning., Disp: , Rfl:    fluconazole (DIFLUCAN) 200 MG tablet, One tab po qd, Disp: 30 tablet, Rfl: 0   Allergies  Allergen Reactions   Lamisil [Terbinafine] Hives     Men's preventive visit. Patient Health Questionnaire (PHQ-2) is  [place] Office Visit from 06/13/2021 in [facility]  PHQ-2 Total Score 0     . Patient is on a healthy diet. Marital status: Married. Relevant history for alcohol use is:  Social History   Substance and Sexual Activity  Alcohol Use Yes   Comment: occas   . Relevant history for tobacco use is:  Social History   Tobacco Use  Smoking Status Never  Smokeless Tobacco Never  .   Review of  Systems  Constitutional: Negative.   HENT: Negative.    Eyes: Negative.   Respiratory: Negative.    Cardiovascular: Negative.   Gastrointestinal: Negative.   Endocrine: Negative.   Genitourinary: Negative.   Musculoskeletal: Negative.   Skin: Negative.   Neurological: Negative.   Hematological: Negative.   Psychiatric/Behavioral: Negative.      Today's Vitals   06/13/21 0845  BP: 112/78  Pulse: (!) 54  Temp: 97.6 F (36.4 C)  Weight: 182 lb 12.8 oz (82.9 kg)  Height: 5' 11.2" (1.808 m)  PainSc: 0-No pain   Body mass index is 25.35 kg/m.  Wt Readings from Last 3 Encounters:  06/13/21 182 lb 12.8 oz (82.9 kg)  12/13/20 183 lb 14.4 oz (83.4 kg)  06/11/20 171 lb (77.6 kg)    Objective:  Physical Exam Vitals and nursing note reviewed.  Constitutional:      Appearance: Normal appearance.  HENT:     Head: Normocephalic and atraumatic.     Right Ear: Tympanic membrane, ear canal and external ear normal.     Left Ear: Tympanic membrane, ear canal and external ear normal.     Mouth/Throat:     Mouth: Mucous membranes are moist.     Pharynx: Oropharynx is clear.  Eyes:     Extraocular Movements: Extraocular movements intact.     Conjunctiva/sclera: Conjunctivae normal.     Pupils: Pupils are equal, round, and reactive to light.  Cardiovascular:     Rate and Rhythm: Normal rate and regular rhythm.     Pulses: Normal pulses.     Heart sounds: Normal heart sounds.  Pulmonary:     Effort: Pulmonary effort is normal.     Breath sounds: Normal breath sounds.  Chest:  Breasts:    Right: Normal. No swelling, bleeding, inverted nipple, mass or nipple discharge.     Left: Normal. No swelling, bleeding, inverted nipple, mass or nipple discharge.  Abdominal:     General: Abdomen is flat. Bowel sounds are normal.     Palpations: Abdomen is soft.  Genitourinary:    Prostate: Normal.     Rectum: Normal. Guaiac result negative. No tenderness. Normal anal tone.     Comments:  Rectal erythema Musculoskeletal:        General: Normal range of motion.     Cervical back: Normal range of motion and neck supple.  Skin:    General: Skin is warm.  Neurological:     General: No focal deficit present.     Mental Status: He is alert.  Psychiatric:        Mood and Affect: Mood normal.        Behavior: Behavior  normal.     Assessment And Plan:    1. Encounter for general adult medical examination w/o abnormal findings Comments: A full exam was performed. DRE performed, stool heme negative. PATIENT IS ADVISED TO GET 30-45 MINUTES REGULAR EXERCISE NO LESS THAN FOUR TO FIVE DAYS PER WEEK - BOTH WEIGHTBEARING EXERCISES AND AEROBIC ARE RECOMMENDED.  PATIENT IS ADVISED TO FOLLOW A HEALTHY DIET WITH AT LEAST SIX FRUITS/VEGGIES PER DAY, DECREASE INTAKE OF RED MEAT, AND TO INCREASE FISH INTAKE TO TWO DAYS PER WEEK.  MEATS/FISH SHOULD NOT BE FRIED, BAKED OR BROILED IS PREFERABLE.  IT IS ALSO IMPORTANT TO CUT BACK ON YOUR SUGAR INTAKE. PLEASE AVOID ANYTHING WITH ADDED SUGAR, CORN SYRUP OR OTHER SWEETENERS. IF YOU MUST USE A SWEETENER, YOU CAN TRY STEVIA. IT IS ALSO IMPORTANT TO AVOID ARTIFICIALLY SWEETENERS AND DIET BEVERAGES. LASTLY, I SUGGEST WEARING SPF 50 SUNSCREEN ON EXPOSED PARTS AND ESPECIALLY WHEN IN THE DIRECT SUNLIGHT FOR AN EXTENDED PERIOD OF TIME.  PLEASE AVOID FAST FOOD RESTAURANTS AND INCREASE YOUR WATER INTAKE. - CBC no Diff - Hemoglobin A1c - Lipid panel - CMP14+EGFR - Apolipoprotein B - Fibrinogen - High sensitivity CRP - Homocysteine - PSA - POC Hemoccult Bld/Stl (1-Cd Office Dx)  2. Pure hypercholesterolemia Comments: Chronic, on statin therapy. Goal LDL<70, prefers to be less than 55. He is commended on lifestyle changes and encouraged to keep up the great work.   3. Onychomycosis Comments: Chronic, he was given rx fluconazole. He plans to take weekly.   4. Prediabetes Comments: His a1c has been elevated in the past. I will recheck this today.   5.  Hypogonadism male Comments: I will check free/total testosterone levels today. He will c/w compounded testosterone supplementation.  - DHEA-sulfate - Testosterone,Free and Total - Estradiol  6. Vitamin D deficiency Comments: I will check vitamin D level and supplement as needed.  - Vitamin D (25 hydroxy)  7. Abnormal thyroid function test Comments: I will check thyroid panel.  - TSH + free T4 - T3, free - T3, reverse - Ferritin  8. BMI 25.0-25.9,adult Comments: He is encouraged to aim for at least 150 minutes of exercise per week.   Patient was given opportunity to ask questions. Patient verbalized understanding of the plan and was able to repeat key elements of the plan. All questions were answered to their satisfaction.   I, Maximino Greenland, MD, have reviewed all documentation for this visit. The documentation on 07/06/21 for the exam, diagnosis, procedures, and orders are all accurate and complete.   THE PATIENT IS ENCOURAGED TO PRACTICE SOCIAL DISTANCING DUE TO THE COVID-19 PANDEMIC.

## 2021-06-13 NOTE — Patient Instructions (Signed)
Health Maintenance, Male Adopting a healthy lifestyle and getting preventive care are important in promoting health and wellness. Ask your health care provider about: The right schedule for you to have regular tests and exams. Things you can do on your own to prevent diseases and keep yourself healthy. What should I know about diet, weight, and exercise? Eat a healthy diet  Eat a diet that includes plenty of vegetables, fruits, low-fat dairy products, and lean protein. Do not eat a lot of foods that are high in solid fats, added sugars, or sodium. Maintain a healthy weight Body mass index (BMI) is a measurement that can be used to identify possible weight problems. It estimates body fat based on height and weight. Your health care provider can help determine your BMI and help you achieve or maintain a healthy weight. Get regular exercise Get regular exercise. This is one of the most important things you can do for your health. Most adults should: Exercise for at least 150 minutes each week. The exercise should increase your heart rate and make you sweat (moderate-intensity exercise). Do strengthening exercises at least twice a week. This is in addition to the moderate-intensity exercise. Spend less time sitting. Even light physical activity can be beneficial. Watch cholesterol and blood lipids Have your blood tested for lipids and cholesterol at 63 years of age, then have this test every 5 years. You may need to have your cholesterol levels checked more often if: Your lipid or cholesterol levels are high. You are older than 63 years of age. You are at high risk for heart disease. What should I know about cancer screening? Many types of cancers can be detected early and may often be prevented. Depending on your health history and family history, you may need to have cancer screening at various ages. This may include screening for: Colorectal cancer. Prostate cancer. Skin cancer. Lung  cancer. What should I know about heart disease, diabetes, and high blood pressure? Blood pressure and heart disease High blood pressure causes heart disease and increases the risk of stroke. This is more likely to develop in people who have high blood pressure readings or are overweight. Talk with your health care provider about your target blood pressure readings. Have your blood pressure checked: Every 3-5 years if you are 18-39 years of age. Every year if you are 40 years old or older. If you are between the ages of 65 and 75 and are a current or former smoker, ask your health care provider if you should have a one-time screening for abdominal aortic aneurysm (AAA). Diabetes Have regular diabetes screenings. This checks your fasting blood sugar level. Have the screening done: Once every three years after age 45 if you are at a normal weight and have a low risk for diabetes. More often and at a younger age if you are overweight or have a high risk for diabetes. What should I know about preventing infection? Hepatitis B If you have a higher risk for hepatitis B, you should be screened for this virus. Talk with your health care provider to find out if you are at risk for hepatitis B infection. Hepatitis C Blood testing is recommended for: Everyone born from 1945 through 1965. Anyone with known risk factors for hepatitis C. Sexually transmitted infections (STIs) You should be screened each year for STIs, including gonorrhea and chlamydia, if: You are sexually active and are younger than 63 years of age. You are older than 63 years of age and your   health care provider tells you that you are at risk for this type of infection. Your sexual activity has changed since you were last screened, and you are at increased risk for chlamydia or gonorrhea. Ask your health care provider if you are at risk. Ask your health care provider about whether you are at high risk for HIV. Your health care provider  may recommend a prescription medicine to help prevent HIV infection. If you choose to take medicine to prevent HIV, you should first get tested for HIV. You should then be tested every 3 months for as long as you are taking the medicine. Follow these instructions at home: Alcohol use Do not drink alcohol if your health care provider tells you not to drink. If you drink alcohol: Limit how much you have to 0-2 drinks a day. Know how much alcohol is in your drink. In the [place] one drink equals one 12 oz bottle of beer (355 mL), one 5 oz glass of wine (148 mL), or one 1 oz glass of hard liquor (44 mL). Lifestyle Do not use any products that contain nicotine or tobacco. These products include cigarettes, chewing tobacco, and vaping devices, such as e-cigarettes. If you need help quitting, ask your health care provider. Do not use street drugs. Do not share needles. Ask your health care provider for help if you need support or information about quitting drugs. General instructions Schedule regular health, dental, and eye exams. Stay current with your vaccines. Tell your health care provider if: You often feel depressed. You have ever been abused or do not feel safe at home. Summary Adopting a healthy lifestyle and getting preventive care are important in promoting health and wellness. Follow your health care provider's instructions about healthy diet, exercising, and getting tested or screened for diseases. Follow your health care provider's instructions on monitoring your cholesterol and blood pressure. This information is not intended to replace advice given to you by your health care provider. Make sure you discuss any questions you have with your health care provider. Document Revised: 06/18/2020 Document Reviewed: 06/18/2020 Elsevier Patient Education  2023 [employer]  

## 2021-06-14 LAB — LIPID PANEL
Chol/HDL Ratio: 2.6 ratio (ref 0.0–5.0)
Cholesterol, Total: 98 mg/dL — ABNORMAL LOW (ref 100–199)
HDL: 38 mg/dL — ABNORMAL LOW (ref >39)
LDL Chol Calc (NIH): 45 mg/dL (ref 0–99)
Triglycerides: 67 mg/dL (ref 0–149)
VLDL Cholesterol Cal: 15 mg/dL (ref 5–40)

## 2021-06-20 ENCOUNTER — Other Ambulatory Visit: Payer: BC Managed Care – PPO

## 2021-06-24 LAB — T3, FREE: T3, Free: 2.9 pg/mL (ref 2.0–4.4)

## 2021-06-24 LAB — TESTOSTERONE,FREE AND TOTAL
Testosterone, Free: 5.8 pg/mL — ABNORMAL LOW (ref 6.6–18.1)
Testosterone: 482 ng/dL (ref 264–916)

## 2021-06-24 LAB — CMP14+EGFR
ALT: 42 IU/L (ref 0–44)
AST: 48 IU/L — ABNORMAL HIGH (ref 0–40)
Albumin/Globulin Ratio: 2 (ref 1.2–2.2)
Albumin: 4.9 g/dL — ABNORMAL HIGH (ref 3.8–4.8)
Alkaline Phosphatase: 44 IU/L (ref 44–121)
BUN/Creatinine Ratio: 17 (ref 10–24)
BUN: 17 mg/dL (ref 8–27)
Bilirubin Total: 0.5 mg/dL (ref 0.0–1.2)
CO2: 26 mmol/L (ref 20–29)
Calcium: 9.4 mg/dL (ref 8.6–10.2)
Chloride: 102 mmol/L (ref 96–106)
Creatinine, Ser: 1.01 mg/dL (ref 0.76–1.27)
Globulin, Total: 2.5 g/dL (ref 1.5–4.5)
Glucose: 91 mg/dL (ref 70–99)
Potassium: 4.7 mmol/L (ref 3.5–5.2)
Sodium: 141 mmol/L (ref 134–144)
Total Protein: 7.4 g/dL (ref 6.0–8.5)
eGFR: 84 mL/min/{1.73_m2} (ref >59)

## 2021-06-24 LAB — TSH+FREE T4
Free T4: 1.38 ng/dL (ref 0.82–1.77)
TSH: 3.24 u[IU]/mL (ref 0.450–4.500)

## 2021-06-24 LAB — HEMOGLOBIN A1C
Est. average glucose Bld gHb Est-mCnc: 123 mg/dL
Hgb A1c MFr Bld: 5.9 % — ABNORMAL HIGH (ref 4.8–5.6)

## 2021-06-24 LAB — CBC
Hematocrit: 45 % (ref 37.5–51.0)
Hemoglobin: 14.2 g/dL (ref 13.0–17.7)
MCH: 24.9 pg — ABNORMAL LOW (ref 26.6–33.0)
MCHC: 31.6 g/dL (ref 31.5–35.7)
MCV: 79 fL (ref 79–97)
Platelets: 194 10*3/uL (ref 150–450)
RBC: 5.7 x10E6/uL (ref 4.14–5.80)
RDW: 13.1 % (ref 11.6–15.4)
WBC: 5.1 10*3/uL (ref 3.4–10.8)

## 2021-06-24 LAB — APOLIPOPROTEIN B: Apolipoprotein B: 66 mg/dL (ref <90)

## 2021-06-24 LAB — VITAMIN D 25 HYDROXY (VIT D DEFICIENCY, FRACTURES): Vit D, 25-Hydroxy: 64.1 ng/mL (ref 30.0–100.0)

## 2021-06-24 LAB — PSA: Prostate Specific Ag, Serum: 0.4 ng/mL (ref 0.0–4.0)

## 2021-06-24 LAB — HOMOCYSTEINE: Homocysteine: 8.6 umol/L (ref 0.0–17.2)

## 2021-06-24 LAB — DHEA-SULFATE: DHEA-SO4: 54.8 ug/dL (ref 48.9–344.2)

## 2021-06-24 LAB — T3, REVERSE: Reverse T3, Serum: 10 ng/dL (ref 9.2–24.1)

## 2021-06-24 LAB — HIGH SENSITIVITY CRP: CRP, High Sensitivity: 0.34 mg/L (ref 0.00–3.00)

## 2021-06-24 LAB — FERRITIN: Ferritin: 50 ng/mL (ref 30–400)

## 2021-06-24 LAB — ESTRADIOL: Estradiol: 19.8 pg/mL (ref 7.6–42.6)

## 2021-06-24 LAB — FIBRINOGEN: Fibrinogen: 225 mg/dL (ref 193–507)

## 2021-07-04 DIAGNOSIS — Z96641 Presence of right artificial hip joint: Secondary | ICD-10-CM | POA: Diagnosis not present

## 2021-07-05 DIAGNOSIS — H401112 Primary open-angle glaucoma, right eye, moderate stage: Secondary | ICD-10-CM | POA: Diagnosis not present

## 2021-07-05 DIAGNOSIS — H04123 Dry eye syndrome of bilateral lacrimal glands: Secondary | ICD-10-CM | POA: Diagnosis not present

## 2021-08-28 IMAGING — DX DG PORTABLE PELVIS
1 series · 1 of 1 positions shown · non-contrast
Comparison: Intraoperative right hip radiographs May 16, 2020

CLINICAL DATA: Status post right total hip replacement

EXAM:
PORTABLE PELVIS 1-2 VIEWS

[pelvis ap]
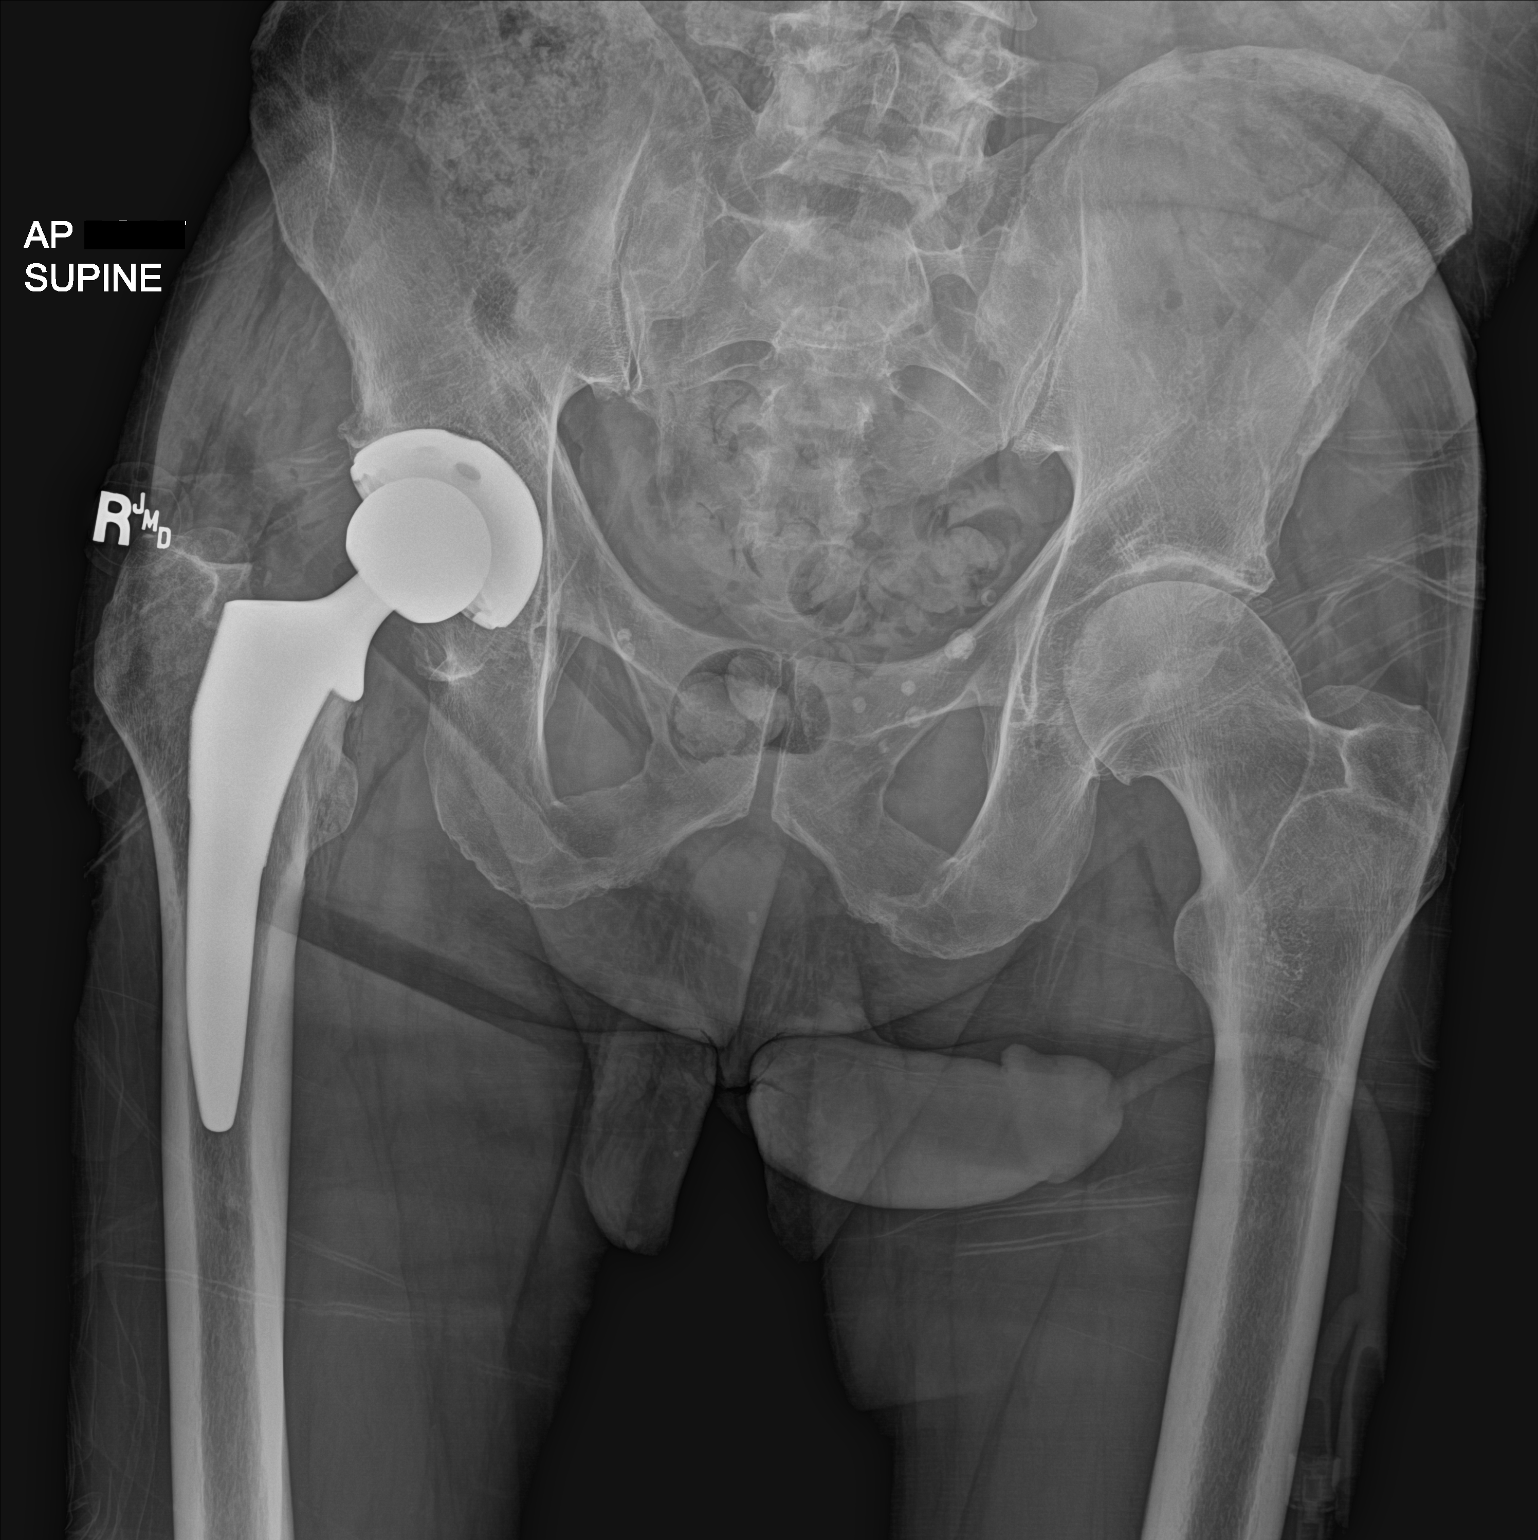

[1 of 1 positions shown; findings below may reference images not displayed]

FINDINGS: Frontal pelvis image obtained. There is a total hip replacement
right with prosthetic components on the right well-seated on frontal
view. No fracture or dislocation. There is mild narrowing of the
left hip joint. Postoperative soft tissue air noted on the right.
IMPRESSION: Total hip replacement right with prosthetic components well-seated
on frontal view. No fracture or dislocation. Mild narrowing left hip
joint.

## 2021-09-06 DIAGNOSIS — H401112 Primary open-angle glaucoma, right eye, moderate stage: Secondary | ICD-10-CM | POA: Diagnosis not present

## 2021-09-06 DIAGNOSIS — Z961 Presence of intraocular lens: Secondary | ICD-10-CM | POA: Diagnosis not present

## 2021-10-30 ENCOUNTER — Other Ambulatory Visit: Payer: Self-pay | Admitting: Internal Medicine

## 2021-10-31 ENCOUNTER — Other Ambulatory Visit: Payer: Self-pay | Admitting: Internal Medicine

## 2021-10-31 DIAGNOSIS — R7303 Prediabetes: Secondary | ICD-10-CM

## 2021-12-19 ENCOUNTER — Ambulatory Visit: Payer: BC Managed Care – PPO | Admitting: Internal Medicine

## 2021-12-21 ENCOUNTER — Other Ambulatory Visit: Payer: Self-pay | Admitting: Internal Medicine

## 2021-12-21 DIAGNOSIS — E7800 Pure hypercholesterolemia, unspecified: Secondary | ICD-10-CM

## 2022-01-01 ENCOUNTER — Ambulatory Visit: Payer: BC Managed Care – PPO | Admitting: Internal Medicine

## 2022-01-16 ENCOUNTER — Encounter: Payer: Self-pay | Admitting: Internal Medicine

## 2022-01-16 ENCOUNTER — Ambulatory Visit (INDEPENDENT_AMBULATORY_CARE_PROVIDER_SITE_OTHER): Payer: BC Managed Care – PPO | Admitting: Internal Medicine

## 2022-01-16 VITALS — BP 122/80 | HR 54 | Temp 97.8°F | Ht 71.2 in | Wt 183.6 lb

## 2022-01-16 DIAGNOSIS — R7303 Prediabetes: Secondary | ICD-10-CM | POA: Diagnosis not present

## 2022-01-16 DIAGNOSIS — Z6825 Body mass index (BMI) 25.0-25.9, adult: Secondary | ICD-10-CM

## 2022-01-16 DIAGNOSIS — E291 Testicular hypofunction: Secondary | ICD-10-CM

## 2022-01-16 DIAGNOSIS — B351 Tinea unguium: Secondary | ICD-10-CM

## 2022-01-16 DIAGNOSIS — E559 Vitamin D deficiency, unspecified: Secondary | ICD-10-CM

## 2022-01-16 DIAGNOSIS — Z79899 Other long term (current) drug therapy: Secondary | ICD-10-CM

## 2022-01-16 DIAGNOSIS — E7800 Pure hypercholesterolemia, unspecified: Secondary | ICD-10-CM

## 2022-01-16 MED ORDER — FLUCONAZOLE 200 MG PO TABS: ORAL_TABLET | ORAL | 0 refills | Status: DC

## 2022-01-16 NOTE — Patient Instructions (Signed)

## 2022-01-16 NOTE — Progress Notes (Signed)
Rich Brave Llittleton,acting as a Education administrator for Maximino Greenland, MD.,have documented all relevant documentation on the behalf of Maximino Greenland, MD,as directed by  Maximino Greenland, MD while in the presence of Maximino Greenland, MD.    Subjective:     Patient ID: Devon Fowler , male    DOB: 1960/05/30 , 61 y.o.   MRN: [id number]   Chief Complaint  Patient presents with   testosterone check    HPI  Patient presents today for a 6 month f/u on his testosterone. He is currently using bioidentical topical testosterone w/o any issues.  He has no specific concerns or complaints at this time. He would also like to check some metabolic parameters today.      Past Medical History:  Diagnosis Date   Arthritis    High cholesterol    Pre-diabetes      Family History  Problem Relation Age of Onset   Diabetes Mother    Heart attack Father      Current Outpatient Medications:    Alpha-Lipoic Acid 600 MG CAPS, Take 600 mg by mouth daily., Disp: , Rfl:    Ascorbic Acid (VITAMIN C) 1000 MG tablet, Take 1,000 mg by mouth daily., Disp: , Rfl:    Cholecalciferol (VITAMIN D-3) 5000 UNIT/ML LIQD, Place 5,000 Units under the tongue 2 (two) times a week., Disp: , Rfl:    Coenzyme Q10 (CO Q-10) 200 MG CAPS, Take 200 mg by mouth 2 (two) times daily., Disp: , Rfl:    Continuous Blood Gluc Sensor (FREESTYLE LIBRE 14 DAY SENSOR) MISC, USE TO CHECK BLOOD SUGAR FOUR TIMES A DAY AS DIRECTED, Disp: 6 each, Rfl: 2   dorzolamide-timolol (COSOPT) 22.3-6.8 MG/ML ophthalmic solution, Place 1 drop into both eyes 2 (two) times daily., Disp: , Rfl:    ezetimibe (ZETIA) 10 MG tablet, TAKE ONE TABLET BY MOUTH DAILY, Disp: 90 tablet, Rfl: 2   finasteride (PROSCAR) 5 MG tablet, Take 0.5 tablets (2.5 mg total) by mouth daily., Disp: 90 tablet, Rfl: 3   GLUTATHIONE PO, Take 250 mg by mouth daily., Disp: , Rfl:    latanoprost (XALATAN) 0.005 % ophthalmic solution, Place 1 drop into both eyes at bedtime., Disp: , Rfl:     metFORMIN (GLUCOPHAGE) 850 MG tablet, TAKE ONE TABLET BY MOUTH TWICE A DAY WITH A MEAL, Disp: 180 tablet, Rfl: 3   minoxidil (LONITEN) 2.5 MG tablet, Take 1 tablet (2.5 mg total) by mouth daily., Disp: 90 tablet, Rfl: 3   Multiple Vitamin (MULTIVITAMIN) tablet, Take 1 tablet by mouth daily., Disp: , Rfl:    omega-3 acid ethyl esters (LOVAZA) 1 g capsule, Take 1 g by mouth daily., Disp: , Rfl:    Resveratrol 250 MG CAPS, Take 250 mg by mouth at bedtime., Disp: , Rfl:    rosuvastatin (CRESTOR) 40 MG tablet, TAKE ONE TABLET BY MOUTH EVERY NIGHT AT BEDTIME, Disp: 90 tablet, Rfl: 2   sildenafil (VIAGRA) 50 MG tablet, Take 1 tablet (50 mg total) by mouth daily as needed for erectile dysfunction., Disp: 30 tablet, Rfl: 1   Testosterone 5.5 MG/ACT GEL, Apply 1 application topically 2 (two) times a week., Disp: , Rfl:    vitamin E 180 MG (400 UNITS) capsule, Take 400 Units by mouth every morning., Disp: , Rfl:    Continuous Blood Gluc Receiver (FREESTYLE LIBRE 3 READER) DEVI, 1 each by Does not apply route in the morning, at noon, and at bedtime. Use to check glucose continuously. Dx  code e11.65, Disp: 1 each, Rfl: 1   Continuous Blood Gluc Sensor (FREESTYLE LIBRE 3 SENSOR) MISC, Place 1 sensor on the skin every 14 days. Use to check glucose continuously. Dx code e11.65, Disp: 2 each, Rfl: 5   fluconazole (DIFLUCAN) 200 MG tablet, One tab po qd, Disp: 30 tablet, Rfl: 0   Allergies  Allergen Reactions   Lamisil [Terbinafine] Hives     Review of Systems  Constitutional: Negative.   Respiratory: Negative.    Cardiovascular: Negative.   Gastrointestinal: Negative.   Neurological: Negative.   Psychiatric/Behavioral: Negative.       Today's Vitals   01/16/22 0951  BP: 122/80  Pulse: (!) 54  Temp: 97.8 F (36.6 C)  Weight: 183 lb 9.6 oz (83.3 kg)  Height: 5' 11.2" (1.808 m)  PainSc: 0-No pain   Body mass index is 25.46 kg/m.  Wt Readings from Last 3 Encounters:  01/16/22 183 lb 9.6 oz (83.3  kg)  06/13/21 182 lb 12.8 oz (82.9 kg)  12/13/20 183 lb 14.4 oz (83.4 kg)     Objective:  Physical Exam Vitals and nursing note reviewed.  Constitutional:      Appearance: Normal appearance.  HENT:     Head: Normocephalic and atraumatic.     Nose:     Comments: Masked     Mouth/Throat:     Comments: Masked  Eyes:     Extraocular Movements: Extraocular movements intact.  Cardiovascular:     Rate and Rhythm: Normal rate and regular rhythm.     Heart sounds: Normal heart sounds.  Pulmonary:     Effort: Pulmonary effort is normal.     Breath sounds: Normal breath sounds.  Musculoskeletal:     Cervical back: Normal range of motion.  Skin:    General: Skin is warm.  Neurological:     General: No focal deficit present.     Mental Status: He is alert.  Psychiatric:        Mood and Affect: Mood normal.         Assessment And Plan:     1. Hypogonadism male Comments: I will check free/total testosterone levels today, along with estradiol and DHEA-S levels.  He will f/u in 4-6 months for re-evaluation. - DHEA-sulfate - Testosterone,Free and Total - Estradiol  2. Pure hypercholesterolemia Comments: Chronic, currently on statin therapy. HE was commended on his heart healthy lifestyle. - CMP14+EGFR - Lipid panel - Apolipoprotein B - High sensitivity CRP - Homocysteine - Uric acid - TSH - T4 - T3, free - T3, reverse  3. Vitamin D deficiency Comments: I will check vitamin D level and supplement as needed. - Vitamin D (25 hydroxy)  4. Prediabetes Comments: I will check labs as below. He was commended on his continued clean eating plan. - Hemoglobin A1c  5. Onychomycosis Comments: Chronic, he wishes to continue with weekly fluconazole.  6. Drug therapy - CBC no Diff - CMP14+EGFR - CK, total   Patient was given opportunity to ask questions. Patient verbalized understanding of the plan and was able to repeat key elements of the plan. All questions were answered to  their satisfaction.   I, Maximino Greenland, MD, have reviewed all documentation for this visit. The documentation on 01/16/22 for the exam, diagnosis, procedures, and orders are all accurate and complete.   IF YOU HAVE BEEN REFERRED TO A SPECIALIST, IT MAY TAKE 1-2 WEEKS TO SCHEDULE/PROCESS THE REFERRAL. IF YOU HAVE NOT HEARD FROM US/SPECIALIST IN TWO WEEKS, PLEASE  GIVE [place] A CALL AT [phone].   THE PATIENT IS ENCOURAGED TO PRACTICE SOCIAL DISTANCING DUE TO THE COVID-19 PANDEMIC.

## 2022-01-22 LAB — CMP14+EGFR
ALT: 48 IU/L — ABNORMAL HIGH (ref 0–44)
AST: 37 IU/L (ref 0–40)
Albumin/Globulin Ratio: 1.9 (ref 1.2–2.2)
Albumin: 5 g/dL — ABNORMAL HIGH (ref 3.9–4.9)
Alkaline Phosphatase: 51 IU/L (ref 44–121)
BUN/Creatinine Ratio: 13 (ref 10–24)
BUN: 12 mg/dL (ref 8–27)
Bilirubin Total: 0.5 mg/dL (ref 0.0–1.2)
CO2: 25 mmol/L (ref 20–29)
Calcium: 9.9 mg/dL (ref 8.6–10.2)
Chloride: 100 mmol/L (ref 96–106)
Creatinine, Ser: 0.89 mg/dL (ref 0.76–1.27)
Globulin, Total: 2.6 g/dL (ref 1.5–4.5)
Glucose: 93 mg/dL (ref 70–99)
Potassium: 4.3 mmol/L (ref 3.5–5.2)
Sodium: 138 mmol/L (ref 134–144)
Total Protein: 7.6 g/dL (ref 6.0–8.5)
eGFR: 96 mL/min/{1.73_m2} (ref >59)

## 2022-01-22 LAB — LIPID PANEL
Chol/HDL Ratio: 2.3 ratio (ref 0.0–5.0)
Cholesterol, Total: 79 mg/dL — ABNORMAL LOW (ref 100–199)
HDL: 35 mg/dL — ABNORMAL LOW (ref >39)
LDL Chol Calc (NIH): 30 mg/dL (ref 0–99)
Triglycerides: 61 mg/dL (ref 0–149)
VLDL Cholesterol Cal: 14 mg/dL (ref 5–40)

## 2022-01-22 LAB — CBC
Hematocrit: 46 % (ref 37.5–51.0)
Hemoglobin: 14.2 g/dL (ref 13.0–17.7)
MCH: 24.5 pg — ABNORMAL LOW (ref 26.6–33.0)
MCHC: 30.9 g/dL — ABNORMAL LOW (ref 31.5–35.7)
MCV: 79 fL (ref 79–97)
Platelets: 227 10*3/uL (ref 150–450)
RBC: 5.79 x10E6/uL (ref 4.14–5.80)
RDW: 13.4 % (ref 11.6–15.4)
WBC: 6.5 10*3/uL (ref 3.4–10.8)

## 2022-01-22 LAB — APOLIPOPROTEIN B: Apolipoprotein B: 47 mg/dL (ref <90)

## 2022-01-22 LAB — HEMOGLOBIN A1C
Est. average glucose Bld gHb Est-mCnc: 126 mg/dL
Hgb A1c MFr Bld: 6 % — ABNORMAL HIGH (ref 4.8–5.6)

## 2022-01-22 LAB — TSH: TSH: 2.63 u[IU]/mL (ref 0.450–4.500)

## 2022-01-22 LAB — T3, REVERSE: Reverse T3, Serum: 18.2 ng/dL (ref 9.2–24.1)

## 2022-01-22 LAB — ESTRADIOL: Estradiol: 65.5 pg/mL — ABNORMAL HIGH (ref 7.6–42.6)

## 2022-01-22 LAB — TESTOSTERONE,FREE AND TOTAL
Testosterone, Free: 5.5 pg/mL — ABNORMAL LOW (ref 6.6–18.1)
Testosterone: 417 ng/dL (ref 264–916)

## 2022-01-22 LAB — DHEA-SULFATE: DHEA-SO4: 61.6 ug/dL (ref 48.9–344.2)

## 2022-01-22 LAB — HOMOCYSTEINE: Homocysteine: 7.8 umol/L (ref 0.0–17.2)

## 2022-01-22 LAB — HIGH SENSITIVITY CRP: CRP, High Sensitivity: 2.34 mg/L (ref 0.00–3.00)

## 2022-01-22 LAB — URIC ACID: Uric Acid: 4.3 mg/dL (ref 3.8–8.4)

## 2022-01-22 LAB — T4: T4, Total: 8.3 ug/dL (ref 4.5–12.0)

## 2022-01-22 LAB — VITAMIN D 25 HYDROXY (VIT D DEFICIENCY, FRACTURES): Vit D, 25-Hydroxy: 63.3 ng/mL (ref 30.0–100.0)

## 2022-01-22 LAB — T3, FREE: T3, Free: 3.1 pg/mL (ref 2.0–4.4)

## 2022-01-22 LAB — CK: Total CK: 273 U/L (ref 41–331)

## 2022-01-28 ENCOUNTER — Encounter: Payer: Self-pay | Admitting: Internal Medicine

## 2022-01-29 ENCOUNTER — Other Ambulatory Visit: Payer: Self-pay

## 2022-01-29 MED ORDER — FREESTYLE LIBRE 3 SENSOR MISC: 5 refills | Status: AC

## 2022-01-29 MED ORDER — FREESTYLE LIBRE 3 READER DEVI: 1.0000 | Freq: Three times a day (TID) | 1 refills | Status: AC

## 2022-03-21 ENCOUNTER — Other Ambulatory Visit: Payer: Self-pay | Admitting: Internal Medicine

## 2022-04-03 ENCOUNTER — Other Ambulatory Visit: Payer: Self-pay | Admitting: Internal Medicine

## 2022-04-04 DIAGNOSIS — H401131 Primary open-angle glaucoma, bilateral, mild stage: Secondary | ICD-10-CM | POA: Diagnosis not present

## 2022-06-26 ENCOUNTER — Encounter: Payer: BC Managed Care – PPO | Admitting: Internal Medicine

## 2022-07-27 ENCOUNTER — Other Ambulatory Visit: Payer: Self-pay | Admitting: Internal Medicine

## 2022-08-03 ENCOUNTER — Encounter: Payer: Self-pay | Admitting: Internal Medicine

## 2022-08-04 ENCOUNTER — Other Ambulatory Visit: Payer: Self-pay | Admitting: Internal Medicine

## 2022-08-04 DIAGNOSIS — R946 Abnormal results of thyroid function studies: Secondary | ICD-10-CM

## 2022-08-04 DIAGNOSIS — E7800 Pure hypercholesterolemia, unspecified: Secondary | ICD-10-CM

## 2022-08-04 DIAGNOSIS — E291 Testicular hypofunction: Secondary | ICD-10-CM

## 2022-08-04 DIAGNOSIS — R7303 Prediabetes: Secondary | ICD-10-CM

## 2022-08-04 DIAGNOSIS — Z79899 Other long term (current) drug therapy: Secondary | ICD-10-CM

## 2022-08-04 DIAGNOSIS — E559 Vitamin D deficiency, unspecified: Secondary | ICD-10-CM

## 2022-08-07 ENCOUNTER — Other Ambulatory Visit: Payer: Self-pay

## 2022-08-07 ENCOUNTER — Other Ambulatory Visit: Payer: BC Managed Care – PPO

## 2022-08-07 DIAGNOSIS — R946 Abnormal results of thyroid function studies: Secondary | ICD-10-CM | POA: Diagnosis not present

## 2022-08-07 DIAGNOSIS — R7303 Prediabetes: Secondary | ICD-10-CM | POA: Diagnosis not present

## 2022-08-07 DIAGNOSIS — E291 Testicular hypofunction: Secondary | ICD-10-CM

## 2022-08-07 DIAGNOSIS — E7800 Pure hypercholesterolemia, unspecified: Secondary | ICD-10-CM

## 2022-08-07 DIAGNOSIS — E559 Vitamin D deficiency, unspecified: Secondary | ICD-10-CM

## 2022-08-18 ENCOUNTER — Encounter: Payer: Self-pay | Admitting: Internal Medicine

## 2022-08-19 LAB — CMP14+EGFR
ALT: 36 IU/L (ref 0–44)
AST: 32 IU/L (ref 0–40)
Albumin: 4.5 g/dL (ref 3.9–4.9)
Alkaline Phosphatase: 46 IU/L (ref 44–121)
BUN/Creatinine Ratio: 16 (ref 10–24)
BUN: 16 mg/dL (ref 8–27)
Bilirubin Total: 0.4 mg/dL (ref 0.0–1.2)
CO2: 26 mmol/L (ref 20–29)
Calcium: 9.4 mg/dL (ref 8.6–10.2)
Chloride: 101 mmol/L (ref 96–106)
Creatinine, Ser: 1.02 mg/dL (ref 0.76–1.27)
Globulin, Total: 2.4 g/dL (ref 1.5–4.5)
Glucose: 96 mg/dL (ref 70–99)
Potassium: 4.5 mmol/L (ref 3.5–5.2)
Sodium: 139 mmol/L (ref 134–144)
Total Protein: 6.9 g/dL (ref 6.0–8.5)
eGFR: 83 mL/min/{1.73_m2} (ref >59)

## 2022-08-19 LAB — THYROID PANEL WITH TSH
Free Thyroxine Index: 2.1 (ref 1.2–4.9)
T3 Uptake Ratio: 28 % (ref 24–39)
T4, Total: 7.4 ug/dL (ref 4.5–12.0)
TSH: 4.56 u[IU]/mL — ABNORMAL HIGH (ref 0.450–4.500)

## 2022-08-19 LAB — TESTOSTERONE,FREE AND TOTAL
Testosterone, Free: 3.5 pg/mL — ABNORMAL LOW (ref 6.6–18.1)
Testosterone: 390 ng/dL (ref 264–916)

## 2022-08-19 LAB — APOLIPOPROTEIN B: Apolipoprotein B: 56 mg/dL (ref <90)

## 2022-08-19 LAB — PSA TOTAL (REFLEX TO FREE): Prostate Specific Ag, Serum: 0.4 ng/mL (ref 0.0–4.0)

## 2022-08-19 LAB — HOMOCYSTEINE: Homocysteine: 9.7 umol/L (ref 0.0–17.2)

## 2022-08-19 LAB — ESTRADIOL: Estradiol: 11.5 pg/mL (ref 7.6–42.6)

## 2022-08-19 LAB — VITAMIN D 25 HYDROXY (VIT D DEFICIENCY, FRACTURES): Vit D, 25-Hydroxy: 44.2 ng/mL (ref 30.0–100.0)

## 2022-08-19 LAB — HEMOGLOBIN A1C
Est. average glucose Bld gHb Est-mCnc: 128 mg/dL
Hgb A1c MFr Bld: 6.1 % — ABNORMAL HIGH (ref 4.8–5.6)

## 2022-08-19 LAB — LIPID PANEL
Chol/HDL Ratio: 2.3 ratio (ref 0.0–5.0)
Cholesterol, Total: 91 mg/dL — ABNORMAL LOW (ref 100–199)
HDL: 40 mg/dL (ref >39)
LDL Chol Calc (NIH): 36 mg/dL (ref 0–99)
Triglycerides: 72 mg/dL (ref 0–149)
VLDL Cholesterol Cal: 15 mg/dL (ref 5–40)

## 2022-08-19 LAB — CK: Total CK: 233 U/L (ref 41–331)

## 2022-08-19 LAB — HIGH SENSITIVITY CRP: CRP, High Sensitivity: 0.34 mg/L (ref 0.00–3.00)

## 2022-08-19 LAB — DHEA-SULFATE, SERUM: DHEA-Sulfate, LCMS: 34 ug/dL

## 2022-08-27 ENCOUNTER — Encounter: Payer: BC Managed Care – PPO | Admitting: Internal Medicine

## 2022-09-03 ENCOUNTER — Other Ambulatory Visit: Payer: Self-pay

## 2022-09-03 DIAGNOSIS — E7800 Pure hypercholesterolemia, unspecified: Secondary | ICD-10-CM

## 2022-09-03 MED ORDER — LEVOTHYROXINE SODIUM 25 MCG PO TABS
25.0000 ug | ORAL_TABLET | Freq: Every day | ORAL | 1 refills | Status: DC
Start: 2022-09-03 — End: 2022-09-29

## 2022-09-16 ENCOUNTER — Other Ambulatory Visit: Payer: Self-pay | Admitting: Internal Medicine

## 2022-09-16 DIAGNOSIS — E7800 Pure hypercholesterolemia, unspecified: Secondary | ICD-10-CM

## 2022-09-25 ENCOUNTER — Ambulatory Visit (INDEPENDENT_AMBULATORY_CARE_PROVIDER_SITE_OTHER): Payer: BC Managed Care – PPO | Admitting: Internal Medicine

## 2022-09-25 ENCOUNTER — Encounter: Payer: Self-pay | Admitting: Internal Medicine

## 2022-09-25 VITALS — BP 106/78 | HR 64 | Temp 98.1°F | Ht 71.0 in | Wt 183.6 lb

## 2022-09-25 DIAGNOSIS — R490 Dysphonia: Secondary | ICD-10-CM | POA: Diagnosis not present

## 2022-09-25 DIAGNOSIS — E291 Testicular hypofunction: Secondary | ICD-10-CM | POA: Diagnosis not present

## 2022-09-25 DIAGNOSIS — Z0000 Encounter for general adult medical examination without abnormal findings: Secondary | ICD-10-CM

## 2022-09-25 DIAGNOSIS — Z0001 Encounter for general adult medical examination with abnormal findings: Secondary | ICD-10-CM

## 2022-09-25 DIAGNOSIS — E7800 Pure hypercholesterolemia, unspecified: Secondary | ICD-10-CM | POA: Diagnosis not present

## 2022-09-25 MED ORDER — SILDENAFIL CITRATE 50 MG PO TABS: 50.0000 mg | ORAL_TABLET | Freq: Every day | ORAL | 1 refills | Status: AC | PRN

## 2022-09-25 MED ORDER — FINASTERIDE 5 MG PO TABS: ORAL_TABLET | ORAL | 3 refills | Status: DC

## 2022-09-25 NOTE — Patient Instructions (Signed)
Health Maintenance, Male Adopting a healthy lifestyle and getting preventive care are important in promoting health and wellness. Ask your health care provider about: The right schedule for you to have regular tests and exams. Things you can do on your own to prevent diseases and keep yourself healthy. What should I know about diet, weight, and exercise? Eat a healthy diet  Eat a diet that includes plenty of vegetables, fruits, low-fat dairy products, and lean protein. Do not eat a lot of foods that are high in solid fats, added sugars, or sodium. Maintain a healthy weight Body mass index (BMI) is a measurement that can be used to identify possible weight problems. It estimates body fat based on height and weight. Your health care provider can help determine your BMI and help you achieve or maintain a healthy weight. Get regular exercise Get regular exercise. This is one of the most important things you can do for your health. Most adults should: Exercise for at least 150 minutes each week. The exercise should increase your heart rate and make you sweat (moderate-intensity exercise). Do strengthening exercises at least twice a week. This is in addition to the moderate-intensity exercise. Spend less time sitting. Even light physical activity can be beneficial. Watch cholesterol and blood lipids Have your blood tested for lipids and cholesterol at 64 years of age, then have this test every 5 years. You may need to have your cholesterol levels checked more often if: Your lipid or cholesterol levels are high. You are older than 64 years of age. You are at high risk for heart disease. What should I know about cancer screening? Many types of cancers can be detected early and may often be prevented. Depending on your health history and family history, you may need to have cancer screening at various ages. This may include screening for: Colorectal cancer. Prostate cancer. Skin cancer. Lung  cancer. What should I know about heart disease, diabetes, and high blood pressure? Blood pressure and heart disease High blood pressure causes heart disease and increases the risk of stroke. This is more likely to develop in people who have high blood pressure readings or are overweight. Talk with your health care provider about your target blood pressure readings. Have your blood pressure checked: Every 3-5 years if you are 18-39 years of age. Every year if you are 40 years old or older. If you are between the ages of 65 and 75 and are a current or former smoker, ask your health care provider if you should have a one-time screening for abdominal aortic aneurysm (AAA). Diabetes Have regular diabetes screenings. This checks your fasting blood sugar level. Have the screening done: Once every three years after age 45 if you are at a normal weight and have a low risk for diabetes. More often and at a younger age if you are overweight or have a high risk for diabetes. What should I know about preventing infection? Hepatitis B If you have a higher risk for hepatitis B, you should be screened for this virus. Talk with your health care provider to find out if you are at risk for hepatitis B infection. Hepatitis C Blood testing is recommended for: Everyone born from 1945 through 1965. Anyone with known risk factors for hepatitis C. Sexually transmitted infections (STIs) You should be screened each year for STIs, including gonorrhea and chlamydia, if: You are sexually active and are younger than 64 years of age. You are older than 64 years of age and your   health care provider tells you that you are at risk for this type of infection. Your sexual activity has changed since you were last screened, and you are at increased risk for chlamydia or gonorrhea. Ask your health care provider if you are at risk. Ask your health care provider about whether you are at high risk for HIV. Your health care provider  may recommend a prescription medicine to help prevent HIV infection. If you choose to take medicine to prevent HIV, you should first get tested for HIV. You should then be tested every 3 months for as long as you are taking the medicine. Follow these instructions at home: Alcohol use Do not drink alcohol if your health care provider tells you not to drink. If you drink alcohol: Limit how much you have to 0-2 drinks a day. Know how much alcohol is in your drink. In the [place] one drink equals one 12 oz bottle of beer (355 mL), one 5 oz glass of wine (148 mL), or one 1 oz glass of hard liquor (44 mL). Lifestyle Do not use any products that contain nicotine or tobacco. These products include cigarettes, chewing tobacco, and vaping devices, such as e-cigarettes. If you need help quitting, ask your health care provider. Do not use street drugs. Do not share needles. Ask your health care provider for help if you need support or information about quitting drugs. General instructions Schedule regular health, dental, and eye exams. Stay current with your vaccines. Tell your health care provider if: You often feel depressed. You have ever been abused or do not feel safe at home. Summary Adopting a healthy lifestyle and getting preventive care are important in promoting health and wellness. Follow your health care provider's instructions about healthy diet, exercising, and getting tested or screened for diseases. Follow your health care provider's instructions on monitoring your cholesterol and blood pressure. This information is not intended to replace advice given to you by your health care provider. Make sure you discuss any questions you have with your health care provider. Document Revised: 06/18/2020 Document Reviewed: 06/18/2020 Elsevier Patient Education  2024 [employer]  

## 2022-09-25 NOTE — Progress Notes (Signed)
I,Victoria T Deloria Lair, CMA,acting as a Neurosurgeon for Gwynneth Aliment, MD.,have documented all relevant documentation on the behalf of Gwynneth Aliment, MD,as directed by  Gwynneth Aliment, MD while in the presence of Gwynneth Aliment, MD.  Subjective:   Patient ID: Devon Fowler , male    DOB: 04/02/1960 , 62 y.o.   MRN: [id number]  Chief Complaint  Patient presents with   Annual Exam   Prediabetes   Hyperlipidemia    HPI  He presents today for a full physical examination. He has no specific concerns or complaints at this time. He reports compliance with meds. Denies headache, chest pain, and SOB.  He reports he and his wife adjusted his rosuvastatin dosage to 40mg  MWF dosing due to fatigue.  He was also having some myalgias; however, also has been lifting weights.    He also has hoarseness that started earlier this week. No sore throat. No headaches. No body aches. He denies known ill contacts.  He has also increased the finasteride dose to 5mg  due to nocturia. Difficult to say if his sx have improved.      Past Medical History:  Diagnosis Date   Arthritis    High cholesterol    Pre-diabetes      Family History  Problem Relation Age of Onset   Diabetes Mother    Heart attack Father      Current Outpatient Medications:    Alpha-Lipoic Acid 600 MG CAPS, Take 600 mg by mouth daily., Disp: , Rfl:    Ascorbic Acid (VITAMIN C) 1000 MG tablet, Take 1,000 mg by mouth daily., Disp: , Rfl:    Cholecalciferol (VITAMIN D-3) 5000 UNIT/ML LIQD, Place 5,000 Units under the tongue 2 (two) times a week., Disp: , Rfl:    Coenzyme Q10 (CO Q-10) 200 MG CAPS, Take 200 mg by mouth 2 (two) times daily., Disp: , Rfl:    Continuous Blood Gluc Receiver (FREESTYLE LIBRE 3 READER) DEVI, 1 each by Does not apply route in the morning, at noon, and at bedtime. Use to check glucose continuously. Dx code e11.65, Disp: 1 each, Rfl: 1   Continuous Blood Gluc Sensor (FREESTYLE LIBRE 14 DAY SENSOR) MISC, USE TO CHECK  BLOOD SUGAR FOUR TIMES A DAY AS DIRECTED, Disp: 6 each, Rfl: 2   Continuous Blood Gluc Sensor (FREESTYLE LIBRE 3 SENSOR) MISC, Place 1 sensor on the skin every 14 days. Use to check glucose continuously. Dx code e11.65, Disp: 2 each, Rfl: 5   dorzolamide-timolol (COSOPT) 22.3-6.8 MG/ML ophthalmic solution, Place 1 drop into both eyes 2 (two) times daily., Disp: , Rfl:    ezetimibe (ZETIA) 10 MG tablet, TAKE 1 TABLET BY MOUTH DAILY, Disp: 90 tablet, Rfl: 2   GLUTATHIONE PO, Take 250 mg by mouth daily., Disp: , Rfl:    latanoprost (XALATAN) 0.005 % ophthalmic solution, Place 1 drop into both eyes at bedtime., Disp: , Rfl:    metFORMIN (GLUCOPHAGE) 850 MG tablet, TAKE ONE TABLET BY MOUTH TWICE A DAY WITH A MEAL, Disp: 180 tablet, Rfl: 3   minoxidil (LONITEN) 2.5 MG tablet, TAKE ONE TABLET BY MOUTH DAILY, Disp: 90 tablet, Rfl: 3   Multiple Vitamin (MULTIVITAMIN) tablet, Take 1 tablet by mouth daily., Disp: , Rfl:    omega-3 acid ethyl esters (LOVAZA) 1 g capsule, Take 1 g by mouth daily., Disp: , Rfl:    Resveratrol 250 MG CAPS, Take 250 mg by mouth at bedtime., Disp: , Rfl:    rosuvastatin (CRESTOR)  40 MG tablet, TAKE ONE TABLET BY MOUTH EVERY NIGHT AT BEDTIME (Patient taking differently: Take 40 mg by mouth at bedtime. He reports taking 3 times a week.), Disp: 90 tablet, Rfl: 2   Testosterone 5.5 MG/ACT GEL, Apply 1 application topically 2 (two) times a week., Disp: , Rfl:    vitamin E 180 MG (400 UNITS) capsule, Take 400 Units by mouth every morning., Disp: , Rfl:    finasteride (PROSCAR) 5 MG tablet, One tab po qd, Disp: 90 tablet, Rfl: 3   fluconazole (DIFLUCAN) 200 MG tablet, One tab po qd (Patient not taking: Reported on 09/25/2022), Disp: 30 tablet, Rfl: 0   levothyroxine (SYNTHROID) 25 MCG tablet, Take 1 tablet (25 mcg total) by mouth daily before breakfast., Disp: 90 tablet, Rfl: 1   sildenafil (VIAGRA) 50 MG tablet, Take 1 tablet (50 mg total) by mouth daily as needed for erectile  dysfunction., Disp: 30 tablet, Rfl: 1   Allergies  Allergen Reactions   Lamisil [Terbinafine] Hives     Men's preventive visit. Patient Health Questionnaire (PHQ-2) is  Flowsheet Row Office Visit from 09/25/2022 in [facility]  PHQ-2 Total Score 0     . Patient is on a healthy diet. Marital status: Married. Relevant history for alcohol use is:  Social History   Substance and Sexual Activity  Alcohol Use Yes   Comment: occas   . Relevant history for tobacco use is:  Social History   Tobacco Use  Smoking Status Never  Smokeless Tobacco Never  .   Review of Systems  Constitutional: Negative.   HENT: Negative.    Eyes: Negative.   Cardiovascular: Negative.   Gastrointestinal: Negative.   Endocrine: Negative.   Genitourinary: Negative.        POS nocturia  Skin: Negative.   Allergic/Immunologic: Negative.   Neurological: Negative.   Hematological: Negative.      Today's Vitals   09/25/22 1540  BP: 106/78  Pulse: 64  Temp: 98.1 F (36.7 C)  SpO2: 98%  Weight: 183 lb 9.6 oz (83.3 kg)  Height: 5\' 11"  (1.803 m)   Body mass index is 25.61 kg/m.  Wt Readings from Last 3 Encounters:  09/25/22 183 lb 9.6 oz (83.3 kg)  01/16/22 183 lb 9.6 oz (83.3 kg)  06/13/21 182 lb 12.8 oz (82.9 kg)    Objective:  Physical Exam Vitals and nursing note reviewed.  Constitutional:      Appearance: Normal appearance.  HENT:     Head: Normocephalic and atraumatic.     Right Ear: Tympanic membrane, ear canal and external ear normal.     Left Ear: Tympanic membrane, ear canal and external ear normal.     Nose: Nose normal.     Mouth/Throat:     Mouth: Mucous membranes are moist.     Pharynx: Oropharynx is clear.  Eyes:     Extraocular Movements: Extraocular movements intact.     Conjunctiva/sclera: Conjunctivae normal.     Pupils: Pupils are equal, round, and reactive to light.  Cardiovascular:     Rate and Rhythm: Normal rate and regular  rhythm.     Pulses: Normal pulses.     Heart sounds: Normal heart sounds.  Pulmonary:     Effort: Pulmonary effort is normal.     Breath sounds: Normal breath sounds.  Chest:  Breasts:    Right: Normal. No swelling, bleeding, inverted nipple, mass or nipple discharge.     Left: Normal. No swelling, bleeding,  inverted nipple, mass or nipple discharge.  Abdominal:     General: Abdomen is flat. Bowel sounds are normal.     Palpations: Abdomen is soft.  Genitourinary:    Comments: Deferred  Musculoskeletal:        General: Normal range of motion.     Cervical back: Normal range of motion and neck supple.  Skin:    General: Skin is warm.  Neurological:     General: No focal deficit present.     Mental Status: He is alert.  Psychiatric:        Mood and Affect: Mood normal.        Behavior: Behavior normal.         Assessment And Plan:    Encounter for general adult medical examination w/o abnormal findings Assessment & Plan: A full exam was performed.  DRE deferred. He agrees with Urology evaluation.   He is advised to get 30-45 minutes of regular exercise, no less than four to five days per week. Both weight-bearing and aerobic exercises are recommended.  She is advised to follow a healthy diet with at least six fruits/veggies per day, decrease intake of red meat and other saturated fats and to increase fish intake to twice weekly.  Meats/fish should not be fried -- baked, boiled or broiled is preferable. It is also important to cut back on your sugar intake.  Be sure to read labels - try to avoid anything with added sugar, high fructose corn syrup or other sweeteners.  If you must use a sweetener, you can try stevia or monkfruit.  It is also important to avoid artificially sweetened foods/beverages and diet drinks. Lastly, wear SPF 50 sunscreen on exposed skin and when in direct sunlight for an extended period of time.  Be sure to avoid fast food restaurants and aim for at least 60  ounces of water daily.      Orders: -     CBC -     Ambulatory referral to Urology -     TSH -     T4, free -     Testosterone,Free and Total  Pure hypercholesterolemia Assessment & Plan: Chronic, currently on rosuvastatin 40mg . He is encouraged to continue with his heart healthy lifestyle.    Hypogonadism male Assessment & Plan: Chronic, currently on supplemental compounded testosterone cream.  As stated above, Urology referral has been placed. They may switch him to testosterone cypionate injections.    Hoarseness Assessment & Plan: Possibly due to postnasal drip. POC COVID test is negative. He may benefit from oral antihistamine such as loratadine or cetirizine.    Other orders -     Finasteride; One tab po qd  Dispense: 90 tablet; Refill: 3 -     Sildenafil Citrate; Take 1 tablet (50 mg total) by mouth daily as needed for erectile dysfunction.  Dispense: 30 tablet; Refill: 1     Return in 6 months (on 03/28/2023), or chol check, for 1 year HM . Patient was given opportunity to ask questions. Patient verbalized understanding of the plan and was able to repeat key elements of the plan. All questions were answered to their satisfaction.     I, Gwynneth Aliment, MD, have reviewed all documentation for this visit. The documentation on 10/05/22 for the exam, diagnosis, procedures, and orders are all accurate and complete.

## 2022-09-26 LAB — CBC
Hematocrit: 43.9 % (ref 37.5–51.0)
Hemoglobin: 13.9 g/dL (ref 13.0–17.7)
MCH: 25.2 pg — ABNORMAL LOW (ref 26.6–33.0)
MCHC: 31.7 g/dL (ref 31.5–35.7)
MCV: 80 fL (ref 79–97)
Platelets: 202 10*3/uL (ref 150–450)
RBC: 5.52 x10E6/uL (ref 4.14–5.80)
RDW: 12.8 % (ref 11.6–15.4)
WBC: 11.9 10*3/uL — ABNORMAL HIGH (ref 3.4–10.8)

## 2022-09-26 LAB — T4, FREE: Free T4: 1.43 ng/dL (ref 0.82–1.77)

## 2022-09-26 LAB — TSH: TSH: 3.08 u[IU]/mL (ref 0.450–4.500)

## 2022-09-26 LAB — TESTOSTERONE,FREE AND TOTAL
Testosterone, Free: 20.6 pg/mL — ABNORMAL HIGH (ref 6.6–18.1)
Testosterone: 542 ng/dL (ref 264–916)

## 2022-09-29 ENCOUNTER — Other Ambulatory Visit: Payer: Self-pay | Admitting: Internal Medicine

## 2022-09-29 DIAGNOSIS — E7800 Pure hypercholesterolemia, unspecified: Secondary | ICD-10-CM

## 2022-09-29 MED ORDER — LEVOTHYROXINE SODIUM 25 MCG PO TABS
25.0000 ug | ORAL_TABLET | Freq: Every day | ORAL | 1 refills | Status: DC
Start: 2022-09-29 — End: 2023-03-27

## 2022-10-03 DIAGNOSIS — H04123 Dry eye syndrome of bilateral lacrimal glands: Secondary | ICD-10-CM | POA: Diagnosis not present

## 2022-10-03 DIAGNOSIS — H401131 Primary open-angle glaucoma, bilateral, mild stage: Secondary | ICD-10-CM | POA: Diagnosis not present

## 2022-10-05 DIAGNOSIS — R490 Dysphonia: Secondary | ICD-10-CM | POA: Insufficient documentation

## 2022-10-05 DIAGNOSIS — Z0000 Encounter for general adult medical examination without abnormal findings: Secondary | ICD-10-CM | POA: Insufficient documentation

## 2022-10-05 NOTE — Assessment & Plan Note (Signed)
A full exam was performed.  DRE deferred. He agrees with Urology evaluation.   He is advised to get 30-45 minutes of regular exercise, no less than four to five days per week. Both weight-bearing and aerobic exercises are recommended.  She is advised to follow a healthy diet with at least six fruits/veggies per day, decrease intake of red meat and other saturated fats and to increase fish intake to twice weekly.  Meats/fish should not be fried -- baked, boiled or broiled is preferable. It is also important to cut back on your sugar intake.  Be sure to read labels - try to avoid anything with added sugar, high fructose corn syrup or other sweeteners.  If you must use a sweetener, you can try stevia or monkfruit.  It is also important to avoid artificially sweetened foods/beverages and diet drinks. Lastly, wear SPF 50 sunscreen on exposed skin and when in direct sunlight for an extended period of time.  Be sure to avoid fast food restaurants and aim for at least 60 ounces of water daily.

## 2022-10-05 NOTE — Assessment & Plan Note (Signed)
Chronic, currently on rosuvastatin 40mg . He is encouraged to continue with his heart healthy lifestyle.

## 2022-10-05 NOTE — Assessment & Plan Note (Addendum)
Chronic, currently on supplemental compounded testosterone cream.  As stated above, Urology referral has been placed. They may switch him to testosterone cypionate injections.

## 2022-10-05 NOTE — Assessment & Plan Note (Signed)
Possibly due to postnasal drip. POC COVID test is negative. He may benefit from oral antihistamine such as loratadine or cetirizine.

## 2022-11-02 ENCOUNTER — Encounter: Payer: Self-pay | Admitting: Internal Medicine

## 2022-11-03 ENCOUNTER — Encounter: Payer: Self-pay | Admitting: Internal Medicine

## 2022-11-09 ENCOUNTER — Other Ambulatory Visit: Payer: Self-pay | Admitting: Internal Medicine

## 2022-11-11 ENCOUNTER — Encounter: Payer: Self-pay | Admitting: Internal Medicine

## 2022-11-12 DIAGNOSIS — Z23 Encounter for immunization: Secondary | ICD-10-CM | POA: Diagnosis not present

## 2022-11-14 ENCOUNTER — Encounter: Payer: Self-pay | Admitting: Internal Medicine

## 2022-11-14 ENCOUNTER — Other Ambulatory Visit: Payer: Self-pay | Admitting: Internal Medicine

## 2022-11-14 DIAGNOSIS — R7303 Prediabetes: Secondary | ICD-10-CM

## 2022-12-25 DIAGNOSIS — N486 Induration penis plastica: Secondary | ICD-10-CM | POA: Diagnosis not present

## 2022-12-25 DIAGNOSIS — N529 Male erectile dysfunction, unspecified: Secondary | ICD-10-CM | POA: Diagnosis not present

## 2023-03-22 ENCOUNTER — Other Ambulatory Visit: Payer: Self-pay | Admitting: Internal Medicine

## 2023-03-27 ENCOUNTER — Other Ambulatory Visit: Payer: Self-pay | Admitting: Internal Medicine

## 2023-03-27 DIAGNOSIS — E7800 Pure hypercholesterolemia, unspecified: Secondary | ICD-10-CM

## 2023-04-02 ENCOUNTER — Ambulatory Visit: Payer: BC Managed Care – PPO | Admitting: Internal Medicine

## 2023-04-10 DIAGNOSIS — Z961 Presence of intraocular lens: Secondary | ICD-10-CM | POA: Diagnosis not present

## 2023-04-10 DIAGNOSIS — H401121 Primary open-angle glaucoma, left eye, mild stage: Secondary | ICD-10-CM | POA: Diagnosis not present

## 2023-04-10 DIAGNOSIS — H401112 Primary open-angle glaucoma, right eye, moderate stage: Secondary | ICD-10-CM | POA: Diagnosis not present

## 2023-04-16 ENCOUNTER — Encounter: Payer: Self-pay | Admitting: Internal Medicine

## 2023-04-16 ENCOUNTER — Ambulatory Visit (INDEPENDENT_AMBULATORY_CARE_PROVIDER_SITE_OTHER): Payer: BC Managed Care – PPO | Admitting: Internal Medicine

## 2023-04-16 VITALS — BP 116/80 | HR 62 | Temp 97.5°F | Ht 71.0 in | Wt 187.8 lb

## 2023-04-16 DIAGNOSIS — E291 Testicular hypofunction: Secondary | ICD-10-CM

## 2023-04-16 DIAGNOSIS — E039 Hypothyroidism, unspecified: Secondary | ICD-10-CM

## 2023-04-16 DIAGNOSIS — R7303 Prediabetes: Secondary | ICD-10-CM | POA: Diagnosis not present

## 2023-04-16 DIAGNOSIS — E7800 Pure hypercholesterolemia, unspecified: Secondary | ICD-10-CM | POA: Diagnosis not present

## 2023-04-16 DIAGNOSIS — E559 Vitamin D deficiency, unspecified: Secondary | ICD-10-CM | POA: Diagnosis not present

## 2023-04-16 MED ORDER — FLUCONAZOLE 200 MG PO TABS: ORAL_TABLET | ORAL | 0 refills | Status: AC

## 2023-04-16 NOTE — Assessment & Plan Note (Signed)
 Chronic, currently on supplemental compounded testosterone cream. He was evaluated by Urology, no change in therapy.

## 2023-04-16 NOTE — Assessment & Plan Note (Addendum)
 Chronic, currently on rosuvastatin 40mg  and ezetimibe 10mg  daily. He is encouraged to continue with his heart healthy lifestyle. He will rto in August 2025 for his next physical exam.

## 2023-04-16 NOTE — Assessment & Plan Note (Signed)
 Previous labs reviewed, his A1c has been elevated in the past. I will check an A1c today. Reminded to avoid refined sugars including sugary drinks/foods and processed meats including bacon, sausages and deli meats.

## 2023-04-16 NOTE — Patient Instructions (Signed)
 Cholesterol Content in Foods Cholesterol is a waxy, fat-like substance that helps to carry fat in the blood. The body needs cholesterol in small amounts, but too much cholesterol can cause damage to the arteries and heart. What foods have cholesterol?  Cholesterol is found in animal-based foods, such as meat, seafood, and dairy. Generally, low-fat dairy and lean meats have less cholesterol than full-fat dairy and fatty meats. The milligrams of cholesterol per serving (mg per serving) of common cholesterol-containing foods are listed below. Meats and other proteins Egg -- one large whole egg has 186 mg. Veal shank -- 4 oz (113 g) has 141 mg. Lean ground [place] (93% lean) -- 4 oz (113 g) has 118 mg. Fat-trimmed lamb loin -- 4 oz (113 g) has 106 mg. Lean ground beef (90% lean) -- 4 oz (113 g) has 100 mg. Lobster -- 3.5 oz (99 g) has 90 mg. Pork loin chops -- 4 oz (113 g) has 86 mg. Canned salmon -- 3.5 oz (99 g) has 83 mg. Fat-trimmed beef top loin -- 4 oz (113 g) has 78 mg. Frankfurter -- 1 frank (3.5 oz or 99 g) has 77 mg. Crab -- 3.5 oz (99 g) has 71 mg. Roasted chicken without skin, white meat -- 4 oz (113 g) has 66 mg. Light bologna -- 2 oz (57 g) has 45 mg. Deli-cut [place] -- 2 oz (57 g) has 31 mg. Canned tuna -- 3.5 oz (99 g) has 31 mg. Tomasa Blase -- 1 oz (28 g) has 29 mg. Oysters and mussels (raw) -- 3.5 oz (99 g) has 25 mg. Mackerel -- 1 oz (28 g) has 22 mg. Trout -- 1 oz (28 g) has 20 mg. Pork sausage -- 1 link (1 oz or 28 g) has 17 mg. Salmon -- 1 oz (28 g) has 16 mg. Tilapia -- 1 oz (28 g) has 14 mg. Dairy Soft-serve ice cream --  cup (4 oz or 86 g) has 103 mg. Whole-milk yogurt -- 1 cup (8 oz or 245 g) has 29 mg. Cheddar cheese -- 1 oz (28 g) has 28 mg. American cheese -- 1 oz (28 g) has 28 mg. Whole milk -- 1 cup (8 oz or 250 mL) has 23 mg. 2% milk -- 1 cup (8 oz or 250 mL) has 18 mg. Cream cheese -- 1 tablespoon (Tbsp) (14.5 g) has 15 mg. Cottage cheese --  cup (4 oz or  113 g) has 14 mg. Low-fat (1%) milk -- 1 cup (8 oz or 250 mL) has 10 mg. Sour cream -- 1 Tbsp (12 g) has 8.5 mg. Low-fat yogurt -- 1 cup (8 oz or 245 g) has 8 mg. Nonfat Greek yogurt -- 1 cup (8 oz or 228 g) has 7 mg. Half-and-half cream -- 1 Tbsp (15 mL) has 5 mg. Fats and oils Cod liver oil -- 1 tablespoon (Tbsp) (13.6 g) has 82 mg. Butter -- 1 Tbsp (14 g) has 15 mg. Lard -- 1 Tbsp (12.8 g) has 14 mg. Bacon grease -- 1 Tbsp (12.9 g) has 14 mg. Mayonnaise -- 1 Tbsp (13.8 g) has 5-10 mg. Margarine -- 1 Tbsp (14 g) has 3-10 mg. The items listed above may not be a complete list of foods with cholesterol. Exact amounts of cholesterol in these foods may vary depending on specific ingredients and brands. Contact a dietitian for more information. What foods do not have cholesterol? Most plant-based foods do not have cholesterol unless you combine them with a food that has  cholesterol. Foods without cholesterol include: Grains and cereals. Vegetables. Fruits. Vegetable oils, such as olive, canola, and sunflower oil. Legumes, such as peas, beans, and lentils. Nuts and seeds. Egg whites. The items listed above may not be a complete list of foods that do not have cholesterol. Contact a dietitian for more information. Summary The body needs cholesterol in small amounts, but too much cholesterol can cause damage to the arteries and heart. Cholesterol is found in animal-based foods, such as meat, seafood, and dairy. Generally, low-fat dairy and lean meats have less cholesterol than full-fat dairy and fatty meats. This information is not intended to replace advice given to you by your health care provider. Make sure you discuss any questions you have with your health care provider. Document Revised: 06/08/2020 Document Reviewed: 06/08/2020 Elsevier Patient Education  2024 [employer].

## 2023-04-16 NOTE — Progress Notes (Signed)
 I,Victoria T Deloria Lair, CMA,acting as a Neurosurgeon for Gwynneth Aliment, MD.,have documented all relevant documentation on the behalf of Gwynneth Aliment, MD,as directed by  Gwynneth Aliment, MD while in the presence of Gwynneth Aliment, MD.  Subjective:  Patient ID: Devon Fowler , male    DOB: [birth year] , 63 y.o.   MRN: [id number]  Chief Complaint  Patient presents with   Hyperlipidemia   Prediabetes    HPI  Patient presents today for cholesterol & prediabetes follow up. He reports compliance with medications. Denies headache, chest pain & sob. He has no specific questions or concerns.      Past Medical History:  Diagnosis Date   Arthritis    High cholesterol    Peyronie disease    Pre-diabetes      Family History  Problem Relation Age of Onset   Diabetes Mother    Heart attack Father      Current Outpatient Medications:    Alpha-Lipoic Acid 600 MG CAPS, Take 600 mg by mouth daily., Disp: , Rfl:    Ascorbic Acid (VITAMIN C) 1000 MG tablet, Take 1,000 mg by mouth daily., Disp: , Rfl:    Cholecalciferol (VITAMIN D-3) 5000 UNIT/ML LIQD, Place 5,000 Units under the tongue 2 (two) times a week., Disp: , Rfl:    Coenzyme Q10 (CO Q-10) 200 MG CAPS, Take 200 mg by mouth 2 (two) times daily., Disp: , Rfl:    Continuous Blood Gluc Receiver (FREESTYLE LIBRE 3 READER) DEVI, 1 each by Does not apply route in the morning, at noon, and at bedtime. Use to check glucose continuously. Dx code e11.65, Disp: 1 each, Rfl: 1   Continuous Blood Gluc Sensor (FREESTYLE LIBRE 3 SENSOR) MISC, Place 1 sensor on the skin every 14 days. Use to check glucose continuously. Dx code e11.65, Disp: 2 each, Rfl: 5   Continuous Glucose Sensor (FREESTYLE LIBRE 14 DAY SENSOR) MISC, USE TO CHECK BLOOD SUGAR FOUR TIMES A DAY AS DIRECTED, Disp: 6 each, Rfl: 1   dorzolamide-timolol (COSOPT) 22.3-6.8 MG/ML ophthalmic solution, Place 1 drop into both eyes 2 (two) times daily., Disp: , Rfl:    ezetimibe (ZETIA) 10 MG tablet, TAKE 1  TABLET BY MOUTH DAILY, Disp: 90 tablet, Rfl: 2   finasteride (PROSCAR) 5 MG tablet, One tab po qd, Disp: 90 tablet, Rfl: 3   GLUTATHIONE PO, Take 250 mg by mouth daily., Disp: , Rfl:    latanoprost (XALATAN) 0.005 % ophthalmic solution, Place 1 drop into both eyes at bedtime., Disp: , Rfl:    levothyroxine (SYNTHROID) 25 MCG tablet, TAKE 1 TABLET BY MOUTH DAILY BEFORE BREAKFAST, Disp: 90 tablet, Rfl: 1   metFORMIN (GLUCOPHAGE) 850 MG tablet, TAKE 1 TABLET BY MOUTH TWICE A DAY WITH A MEAL, Disp: 180 tablet, Rfl: 3   minoxidil (LONITEN) 2.5 MG tablet, TAKE ONE TABLET BY MOUTH DAILY, Disp: 90 tablet, Rfl: 3   Multiple Vitamin (MULTIVITAMIN) tablet, Take 1 tablet by mouth daily., Disp: , Rfl:    rosuvastatin (CRESTOR) 40 MG tablet, TAKE ONE TABLET BY MOUTH EVERY NIGHT AT BEDTIME (Patient taking differently: Take 40 mg by mouth at bedtime. He reports taking 3 times a week.), Disp: 90 tablet, Rfl: 2   sildenafil (VIAGRA) 50 MG tablet, Take 1 tablet (50 mg total) by mouth daily as needed for erectile dysfunction., Disp: 30 tablet, Rfl: 1   Testosterone 5.5 MG/ACT GEL, Apply 1 application topically 2 (two) times a week., Disp: , Rfl:  vitamin E 180 MG (400 UNITS) capsule, Take 400 Units by mouth every morning., Disp: , Rfl:    fluconazole (DIFLUCAN) 200 MG tablet, One tab po qd, Disp: 30 tablet, Rfl: 0   Allergies  Allergen Reactions   Lamisil [Terbinafine] Hives     Review of Systems  Constitutional: Negative.   HENT: Negative.    Respiratory: Negative.    Cardiovascular: Negative.   Gastrointestinal: Negative.   Genitourinary: Negative.   Skin: Negative.   Allergic/Immunologic: Negative.   Neurological: Negative.   Hematological: Negative.      Today's Vitals   04/16/23 0932  BP: 116/80  Pulse: 62  Temp: (!) 97.5 F (36.4 C)  SpO2: 98%  Weight: 187 lb 12.8 oz (85.2 kg)  Height: 5\' 11"  (1.803 m)   Body mass index is 26.19 kg/m.  Wt Readings from Last 3 Encounters:  04/16/23  187 lb 12.8 oz (85.2 kg)  09/25/22 183 lb 9.6 oz (83.3 kg)  01/16/22 183 lb 9.6 oz (83.3 kg)     Objective:  Physical Exam Vitals and nursing note reviewed.  Constitutional:      Appearance: Normal appearance.  HENT:     Head: Normocephalic and atraumatic.  Eyes:     Extraocular Movements: Extraocular movements intact.  Cardiovascular:     Rate and Rhythm: Normal rate and regular rhythm.     Heart sounds: Normal heart sounds.  Pulmonary:     Effort: Pulmonary effort is normal.     Breath sounds: Normal breath sounds.  Musculoskeletal:     Cervical back: Normal range of motion.  Skin:    General: Skin is warm.  Neurological:     General: No focal deficit present.     Mental Status: He is alert.  Psychiatric:        Mood and Affect: Mood normal.         Assessment And Plan:  Pure hypercholesterolemia Assessment & Plan: Chronic, currently on rosuvastatin 40mg  and ezetimibe 10mg  daily. He is encouraged to continue with his heart healthy lifestyle. He will rto in August 2025 for his next physical exam.   Orders: -     CMP14+EGFR -     NMR LipoProfile+Lipids+IR -     T3, free  Prediabetes Assessment & Plan: Previous labs reviewed, his A1c has been elevated in the past. I will check an A1c today. Reminded to avoid refined sugars including sugary drinks/foods and processed meats including bacon, sausages and deli meats.    Orders: -     Hemoglobin A1c  Hypogonadism male Assessment & Plan: Chronic, currently on supplemental compounded testosterone cream. He was evaluated by Urology, no change in therapy.   Orders: -     Testosterone,Free and Total  Primary hypothyroidism Assessment & Plan: Chronic, currently taking generic levothyroxine daily.  I will check thyroid panel and adjust meds as needed.   Orders: -     TSH + free T4 -     T3, free  Other orders -     Fluconazole; One tab po qd  Dispense: 30 tablet; Refill: 0     Return if symptoms  worsen or fail to improve.  Patient was given opportunity to ask questions. Patient verbalized understanding of the plan and was able to repeat key elements of the plan. All questions were answered to their satisfaction.    I, Gwynneth Aliment, MD, have reviewed all documentation for this visit. The documentation on 04/16/23 for the exam, diagnosis, procedures, and  orders are all accurate and complete.   IF YOU HAVE BEEN REFERRED TO A SPECIALIST, IT MAY TAKE 1-2 WEEKS TO SCHEDULE/PROCESS THE REFERRAL. IF YOU HAVE NOT HEARD FROM US/SPECIALIST IN TWO WEEKS, PLEASE GIVE [place] A CALL AT [phone].   THE PATIENT IS ENCOURAGED TO PRACTICE SOCIAL DISTANCING DUE TO THE COVID-19 PANDEMIC.

## 2023-04-16 NOTE — Assessment & Plan Note (Signed)
 Chronic, currently taking generic levothyroxine daily.  I will check thyroid panel and adjust meds as needed.

## 2023-04-18 LAB — NMR LIPOPROFILE+LIPIDS+IR
Cholesterol, Total: 101 mg/dL (ref 100–199)
HDL Particle Number: 29.8 umol/L — ABNORMAL LOW (ref >=30.5)
HDL Size: 9.3 nm (ref >=9.2)
HDL-C: 45 mg/dL (ref >39)
LDL Particle Number: 600 nmol/L (ref <1000)
LDL Size: 20 nm — ABNORMAL LOW (ref >20.5)
LDL Size: 20 nm — ABNORMAL LOW (ref >=20.8)
LDL-C (NIH Calc): 42 mg/dL (ref 0–99)
LP-IR Score: <25 (ref <=45)
Large HDL-P: 4.5 umol/L — ABNORMAL LOW (ref >=4.8)
Large VLDL-P: <0.8 nmol/L (ref <=2.7)
Small LDL Particle Number: 370 nmol/L (ref <=527)
Small LDL-P: 370 nmol/L (ref <=527)
Triglycerides: 65 mg/dL (ref 0–149)
VLDL Size: 40 nm (ref <=46.6)

## 2023-04-22 ENCOUNTER — Encounter: Payer: Self-pay | Admitting: Internal Medicine

## 2023-04-22 LAB — TSH+FREE T4
Free T4: 1.51 ng/dL (ref 0.82–1.77)
TSH: 2.92 u[IU]/mL (ref 0.450–4.500)

## 2023-04-22 LAB — CMP14+EGFR
ALT: 37 IU/L (ref 0–44)
AST: 35 IU/L (ref 0–40)
Albumin: 4.6 g/dL (ref 3.9–4.9)
Alkaline Phosphatase: 34 IU/L — ABNORMAL LOW (ref 44–121)
BUN/Creatinine Ratio: 13 (ref 10–24)
BUN: 12 mg/dL (ref 8–27)
Bilirubin Total: 0.5 mg/dL (ref 0.0–1.2)
CO2: 23 mmol/L (ref 20–29)
Calcium: 9.1 mg/dL (ref 8.6–10.2)
Chloride: 104 mmol/L (ref 96–106)
Creatinine, Ser: 0.92 mg/dL (ref 0.76–1.27)
Globulin, Total: 2.5 g/dL (ref 1.5–4.5)
Glucose: 99 mg/dL (ref 70–99)
Potassium: 4.2 mmol/L (ref 3.5–5.2)
Sodium: 141 mmol/L (ref 134–144)
Total Protein: 7.1 g/dL (ref 6.0–8.5)
eGFR: 93 mL/min/{1.73_m2} (ref >59)

## 2023-04-22 LAB — TESTOSTERONE,FREE AND TOTAL
Testosterone, Free: 8.7 pg/mL (ref 6.6–18.1)
Testosterone: 719 ng/dL (ref 264–916)

## 2023-04-22 LAB — HEMOGLOBIN A1C
Est. average glucose Bld gHb Est-mCnc: 123 mg/dL
Hgb A1c MFr Bld: 5.9 % — ABNORMAL HIGH (ref 4.8–5.6)

## 2023-04-22 LAB — T3, FREE: T3, Free: 2.9 pg/mL (ref 2.0–4.4)

## 2023-06-04 ENCOUNTER — Other Ambulatory Visit: Payer: Self-pay | Admitting: Internal Medicine

## 2023-06-18 ENCOUNTER — Other Ambulatory Visit: Payer: Self-pay | Admitting: Internal Medicine

## 2023-06-18 DIAGNOSIS — E7800 Pure hypercholesterolemia, unspecified: Secondary | ICD-10-CM

## 2023-07-13 ENCOUNTER — Telehealth: Payer: Self-pay

## 2023-09-20 ENCOUNTER — Other Ambulatory Visit: Payer: Self-pay | Admitting: Internal Medicine

## 2023-09-20 DIAGNOSIS — E7800 Pure hypercholesterolemia, unspecified: Secondary | ICD-10-CM

## 2023-09-26 ENCOUNTER — Encounter: Payer: Self-pay | Admitting: Internal Medicine

## 2023-10-01 ENCOUNTER — Other Ambulatory Visit: Payer: Self-pay | Admitting: Internal Medicine

## 2023-10-01 DIAGNOSIS — E039 Hypothyroidism, unspecified: Secondary | ICD-10-CM

## 2023-10-01 DIAGNOSIS — E559 Vitamin D deficiency, unspecified: Secondary | ICD-10-CM

## 2023-10-01 DIAGNOSIS — R7303 Prediabetes: Secondary | ICD-10-CM

## 2023-10-01 DIAGNOSIS — E291 Testicular hypofunction: Secondary | ICD-10-CM

## 2023-10-01 DIAGNOSIS — E7800 Pure hypercholesterolemia, unspecified: Secondary | ICD-10-CM

## 2023-10-02 ENCOUNTER — Other Ambulatory Visit

## 2023-10-02 DIAGNOSIS — E7800 Pure hypercholesterolemia, unspecified: Secondary | ICD-10-CM

## 2023-10-02 DIAGNOSIS — E291 Testicular hypofunction: Secondary | ICD-10-CM

## 2023-10-02 DIAGNOSIS — E039 Hypothyroidism, unspecified: Secondary | ICD-10-CM

## 2023-10-02 DIAGNOSIS — R7303 Prediabetes: Secondary | ICD-10-CM

## 2023-10-02 DIAGNOSIS — E559 Vitamin D deficiency, unspecified: Secondary | ICD-10-CM

## 2023-10-03 LAB — LIPID PANEL+APOB
Apolipoprotein B: 77 mg/dL (ref <90)
Cholesterol, Total: 115 mg/dL (ref 100–199)
HDL-C: 45 mg/dL (ref >39)
LDL-C (NIH Calc): 55 mg/dL (ref 0–99)
Non-HDL Cholesterol: 70 mg/dL (ref 0–129)
Triglycerides: 76 mg/dL (ref 0–149)

## 2023-10-03 LAB — URINALYSIS, COMPLETE
Bilirubin, UA: NEGATIVE
Glucose, UA: NEGATIVE
Ketones, UA: NEGATIVE
Leukocytes,UA: NEGATIVE
Nitrite, UA: NEGATIVE
Protein,UA: NEGATIVE
RBC, UA: NEGATIVE
Specific Gravity, UA: 1.015 (ref 1.005–1.030)
Urobilinogen, Ur: 0.2 mg/dL (ref 0.2–1.0)
pH, UA: 6 (ref 5.0–7.5)

## 2023-10-03 LAB — MICROSCOPIC EXAMINATION
Bacteria, UA: NONE SEEN
Casts: NONE SEEN /LPF
Epithelial Cells (non renal): NONE SEEN /HPF (ref 0–10)
RBC, Urine: NONE SEEN /HPF (ref 0–2)
WBC, UA: NONE SEEN /HPF (ref 0–5)

## 2023-10-03 LAB — MICROALBUMIN / CREATININE URINE RATIO
Creatinine, Urine: 92.7 mg/dL
Microalb/Creat Ratio: 6 mg/g{creat} (ref 0–29)
Microalbumin, Urine: 5.9 ug/mL

## 2023-10-07 LAB — CBC
Hematocrit: 49.4 % (ref 37.5–51.0)
Hemoglobin: 15 g/dL (ref 13.0–17.7)
MCH: 25 pg — ABNORMAL LOW (ref 26.6–33.0)
MCHC: 30.4 g/dL — ABNORMAL LOW (ref 31.5–35.7)
MCV: 82 fL (ref 79–97)
Platelets: 249 x10E3/uL (ref 150–450)
RBC: 6.01 x10E6/uL — ABNORMAL HIGH (ref 4.14–5.80)
RDW: 13.1 % (ref 11.6–15.4)
WBC: 6.1 x10E3/uL (ref 3.4–10.8)

## 2023-10-07 LAB — CMP14+EGFR
ALT: 40 IU/L (ref 0–44)
AST: 33 IU/L (ref 0–40)
Albumin: 4.9 g/dL (ref 3.9–4.9)
Alkaline Phosphatase: 36 IU/L — ABNORMAL LOW (ref 44–121)
BUN/Creatinine Ratio: 16 (ref 10–24)
BUN: 20 mg/dL (ref 8–27)
Bilirubin Total: 0.5 mg/dL (ref 0.0–1.2)
CO2: 25 mmol/L (ref 20–29)
Calcium: 9.9 mg/dL (ref 8.6–10.2)
Chloride: 101 mmol/L (ref 96–106)
Creatinine, Ser: 1.25 mg/dL (ref 0.76–1.27)
Globulin, Total: 2.8 g/dL (ref 1.5–4.5)
Glucose: 94 mg/dL (ref 70–99)
Potassium: 4.6 mmol/L (ref 3.5–5.2)
Sodium: 140 mmol/L (ref 134–144)
Total Protein: 7.7 g/dL (ref 6.0–8.5)
eGFR: 64 mL/min/1.73 (ref >59)

## 2023-10-07 LAB — HEMOGLOBIN A1C
Est. average glucose Bld gHb Est-mCnc: 126 mg/dL
Hgb A1c MFr Bld: 6 % — ABNORMAL HIGH (ref 4.8–5.6)

## 2023-10-07 LAB — DHEA-SULFATE: DHEA-SO4: 56.8 ug/dL (ref 48.9–344.2)

## 2023-10-07 LAB — VITAMIN D 25 HYDROXY (VIT D DEFICIENCY, FRACTURES): Vit D, 25-Hydroxy: 55 ng/mL (ref 30.0–100.0)

## 2023-10-07 LAB — PSA: Prostate Specific Ag, Serum: 0.4 ng/mL (ref 0.0–4.0)

## 2023-10-07 LAB — TESTOSTERONE,FREE AND TOTAL
Testosterone, Free: 4.9 pg/mL — ABNORMAL LOW (ref 6.6–18.1)
Testosterone: 469 ng/dL (ref 264–916)

## 2023-10-07 LAB — THYROID PANEL WITH TSH
Free Thyroxine Index: 2.6 (ref 1.2–4.9)
T3 Uptake Ratio: 30 % (ref 24–39)
T4, Total: 8.8 ug/dL (ref 4.5–12.0)
TSH: 3.95 u[IU]/mL (ref 0.450–4.500)

## 2023-10-07 LAB — HIGH SENSITIVITY CRP: CRP, High Sensitivity: 0.3 mg/L (ref 0.00–3.00)

## 2023-10-07 LAB — CK: Total CK: 358 U/L — ABNORMAL HIGH (ref 41–331)

## 2023-10-07 LAB — ESTRADIOL: Estradiol: 104 pg/mL — ABNORMAL HIGH (ref 7.6–42.6)

## 2023-10-07 LAB — HOMOCYSTEINE: Homocysteine: 10.6 umol/L (ref 0.0–17.2)

## 2023-10-07 LAB — T3, REVERSE: Reverse T3, Serum: 21.3 ng/dL (ref 9.2–24.1)

## 2023-10-07 LAB — LIPOPROTEIN A (LPA): Lipoprotein (a): 18.1 nmol/L (ref <75.0)

## 2023-10-07 LAB — URIC ACID: Uric Acid: 5.3 mg/dL (ref 3.8–8.4)

## 2023-10-08 ENCOUNTER — Encounter: Payer: Self-pay | Admitting: Internal Medicine

## 2023-10-08 ENCOUNTER — Other Ambulatory Visit

## 2023-10-08 ENCOUNTER — Ambulatory Visit (INDEPENDENT_AMBULATORY_CARE_PROVIDER_SITE_OTHER): Admitting: Internal Medicine

## 2023-10-08 VITALS — BP 110/70 | HR 60 | Temp 98.3°F | Ht 71.0 in | Wt 187.0 lb

## 2023-10-08 DIAGNOSIS — Z0000 Encounter for general adult medical examination without abnormal findings: Secondary | ICD-10-CM | POA: Diagnosis not present

## 2023-10-08 DIAGNOSIS — Z23 Encounter for immunization: Secondary | ICD-10-CM

## 2023-10-08 DIAGNOSIS — E298 Other testicular dysfunction: Secondary | ICD-10-CM

## 2023-10-08 DIAGNOSIS — R7303 Prediabetes: Secondary | ICD-10-CM | POA: Diagnosis not present

## 2023-10-08 DIAGNOSIS — E039 Hypothyroidism, unspecified: Secondary | ICD-10-CM | POA: Diagnosis not present

## 2023-10-08 DIAGNOSIS — E7800 Pure hypercholesterolemia, unspecified: Secondary | ICD-10-CM

## 2023-10-08 DIAGNOSIS — E291 Testicular hypofunction: Secondary | ICD-10-CM

## 2023-10-08 NOTE — Progress Notes (Signed)
 Subjective:    Devon Fowler is a 63 y.o. male who presents for a Welcome to Medicare exam.   Cardiac Risk Factors include: advanced age (>4men, >1 women);dyslipidemia;male gender;family history of premature cardiovascular disease Discussed the use of AI scribe software for clinical note transcription with the patient, who gave verbal consent to proceed.  History of Present Illness Devon Fowler is a 63 year old male who presents for a Welcome to Medicare visit.  He is currently taking rosuvastatin  40 mg every other day and is considering switching to 20 mg daily. No muscle cramps, but he is concerned about the potential for rhabdomyolysis due to his use of rosuvastatin  and weightlifting.  He has a family history of premature heart disease, with his father having a heart attack at a young age. He had a calcium  score done many years ago and has not seen a cardiologist since. Calcium  score was elevated.  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 His mood is described as great. He has been told he snores. He reports normal bowel movements occurring daily.  He received a tetanus vaccine in 2022. He has advanced directives in place, including a living will and healthcare power of attorney.  His estradiol  level is high, and he notes that his wife uses topical estrogen. He is taking Coenzyme Q10 and glutathione twice a day, along with cruciferous vegetables and broccoli sprouts.      Objective:    Today's Vitals   10/08/23 1504  BP: 110/70  Pulse: 60  Temp: 98.3 F (36.8 C)  SpO2: 98%  Weight: 187 lb (84.8 kg)  Height: 5' 11 (1.803 m)   Body mass index is 26.08 kg/m.  Medications Outpatient Encounter Medications as of 10/08/2023  Medication Sig   Alpha-Lipoic Acid 600 MG CAPS Take 600 mg by mouth daily.   Ascorbic Acid (VITAMIN C) 1000 MG tablet Take 1,000 mg by mouth daily.   Cholecalciferol (VITAMIN D -3) 5000 UNIT/ML LIQD Place 5,000  Units under the tongue 2 (two) times a week.   Coenzyme Q10 (CO Q-10) 200 MG CAPS Take 200 mg by mouth 2 (two) times daily.   Continuous Blood Gluc Receiver (FREESTYLE LIBRE 3 READER) DEVI 1 each by Does not apply route in the morning, at noon, and at bedtime. Use to check glucose continuously. Dx code e11.65   Continuous Blood Gluc Sensor (FREESTYLE LIBRE 3 SENSOR) MISC Place 1 sensor on the skin every 14 days. Use to check glucose continuously. Dx code e11.65   Continuous Glucose Sensor (FREESTYLE LIBRE 14 DAY SENSOR) MISC USE TO CHECK BLOOD SUGAR FOUR TIMES A DAY AS DIRECTED   dorzolamide -timolol  (COSOPT ) 22.3-6.8 MG/ML ophthalmic solution Place 1 drop into both eyes 2 (two) times daily.   ezetimibe  (ZETIA ) 10 MG tablet TAKE 1 TABLET BY MOUTH DAILY   finasteride  (PROSCAR ) 5 MG tablet TAKE 1 TABLET BY MOUTH DAILY   GLUTATHIONE PO Take 250 mg by mouth daily.   latanoprost  (XALATAN ) 0.005 % ophthalmic solution Place 1 drop into both eyes at bedtime.   levothyroxine  (SYNTHROID ) 25 MCG tablet TAKE 1 TABLET BY MOUTH DAILY BEFORE BREAKFAST   metFORMIN  (GLUCOPHAGE ) 850 MG tablet TAKE 1 TABLET BY MOUTH TWICE A DAY WITH A MEAL   minoxidil  (LONITEN ) 2.5 MG tablet TAKE 1 TABLET BY MOUTH DAILY   Multiple Vitamin (MULTIVITAMIN) tablet Take 1 tablet by mouth daily.   rosuvastatin  (CRESTOR ) 40 MG tablet Take 1  tablet (40 mg total) by mouth at bedtime. He reports taking 3 times a week. (Patient taking differently: Take 40 mg by mouth at bedtime. He reports taking every other day.)   sildenafil  (VIAGRA ) 50 MG tablet Take 1 tablet (50 mg total) by mouth daily as needed for erectile dysfunction.   Testosterone  5.5 MG/ACT GEL Apply 1 application topically 2 (two) times a week.   vitamin E 180 MG (400 UNITS) capsule Take 400 Units by mouth every morning.   fluconazole  (DIFLUCAN ) 200 MG tablet One tab po qd (Patient not taking: Reported on 10/08/2023)   No facility-administered encounter medications on file as of  10/08/2023.     History: Past Medical History:  Diagnosis Date   Arthritis    Cataract treated   Glaucoma    High cholesterol    Peyronie disease    Pre-diabetes    Past Surgical History:  Procedure Laterality Date   CATARACT EXTRACTION Right 05/23/2020   Left - 06/06/20   EYE SURGERY  4/13 &06/06/2020   FRACTURE SURGERY  02/2013   JOINT REPLACEMENT  05/16/2020   ORIF ANKLE FRACTURE Left 02/14/2013   DR XU   ORIF ANKLE FRACTURE Left 02/14/2013   Procedure: OPEN REDUCTION INTERNAL FIXATION (ORIF) ANKLE FRACTURE;  Surgeon: Kay Ozell Cummins, MD;  Location: [facility] OR;  Service: Orthopedics;  Laterality: Left;   TOTAL HIP ARTHROPLASTY Right 05/16/2020   Procedure: TOTAL HIP ARTHROPLASTY ANTERIOR APPROACH;  Surgeon: Melodi Lerner, MD;  Location: [facility];  Service: Orthopedics;  Laterality: Right;    VASECTOMY  2003    Family History  Problem Relation Age of Onset   Diabetes Mother    Heart attack Father    Social History   Occupational History   Not on file  Tobacco Use   Smoking status: Never   Smokeless tobacco: Never  Vaping Use   Vaping status: Never Used  Substance and Sexual Activity   Alcohol use: Yes    Comment: occas    Drug use: No   Sexual activity: Not on file    Tobacco Counseling Counseling given: Yes   Immunizations and Health Maintenance Immunization History  Administered Date(s) Administered    sv, Bivalent, Protein Subunit Rsvpref,pf [place]) 11/09/2021   Influenza-Unspecified 10/15/2017, 11/01/2021   Moderna Sars-Covid-2 Vaccination 03/03/2019, 03/24/2019, 12/03/2019   Pfizer Covid-19 Vaccine Bivalent Booster 12yrs & up 10/27/2020   Pfizer(Comirnaty)Fall Seasonal Vaccine 12 years and older 11/09/2021, 10/31/2022   Tdap 06/11/2020   Zoster Recombinant(Shingrix) 10/24/2018, 01/29/2019   Health Maintenance Due  Topic Date Due   HIV Screening  Never done   Pneumococcal Vaccine: 50+ Years (1 of 1 - PCV) Never done   Influenza Vaccine   09/11/2023   COVID-19 Vaccine (7 - Moderna risk 2024-25 season) 10/12/2023    Activities of Daily Living    10/08/2023    2:51 PM  In your present state of health, do you have any difficulty performing the following activities:  Hearing? 0  Vision? 0  Difficulty concentrating or making decisions? 0  Walking or climbing stairs? 0  Dressing or bathing? 0  Doing errands, shopping? 0  Preparing Food and eating ? N  Using the Toilet? N  In the past six months, have you accidently leaked urine? N  Do you have problems with loss of bowel control? N  Managing your Medications? N  Managing your Finances? N  Housekeeping or managing your Housekeeping? N    Physical Exam   Physical Exam Vitals and  nursing note reviewed.  Constitutional:      Appearance: Normal appearance.  HENT:     Head: Normocephalic and atraumatic.     Right Ear: Tympanic membrane, ear canal and external ear normal.     Left Ear: Tympanic membrane, ear canal and external ear normal.     Nose: Nose normal.     Mouth/Throat:     Mouth: Mucous membranes are moist.     Pharynx: Oropharynx is clear.  Eyes:     Extraocular Movements: Extraocular movements intact.     Conjunctiva/sclera: Conjunctivae normal.     Pupils: Pupils are equal, round, and reactive to light.  Cardiovascular:     Rate and Rhythm: Normal rate and regular rhythm.     Pulses: Normal pulses.     Heart sounds: Normal heart sounds.  Pulmonary:     Effort: Pulmonary effort is normal.     Breath sounds: Normal breath sounds.  Chest:  Breasts:    Right: Normal. No swelling, bleeding, inverted nipple, mass or nipple discharge.     Left: Normal. No swelling, bleeding, inverted nipple, mass or nipple discharge.  Abdominal:     General: Abdomen is flat. Bowel sounds are normal.     Palpations: Abdomen is soft.  Genitourinary:    Comments: Deferred  Musculoskeletal:        General: Normal range of motion.     Cervical back: Normal range of motion  and neck supple.  Skin:    General: Skin is warm.  Neurological:     General: No focal deficit present.     Mental Status: He is alert.  Psychiatric:        Mood and Affect: Mood normal.        Behavior: Behavior normal.     Advanced Directives: Does Patient Have a Medical Advance Directive?: Yes Type of Advance Directive: Healthcare Power of Attorney, Living will Copy of Healthcare Power of Attorney in Chart?: No - copy requested   EKG:  this was not done.     Assessment:    This is a routine wellness  examination for this patient .   Vision/Hearing screen Hearing Screening   500Hz  1000Hz  2000Hz  4000Hz   Right ear Pass Pass Pass Pass  Left ear Fail Pass Pass Pass   Vision Screening   Right eye Left eye Both eyes  Without correction     With correction 20/50 20/30 20/30      Goals       Stay Healthy/ Get Stonger (pt-stated)      Lift weight twice a week with personal trainer.          Depression Screen    10/08/2023    2:57 PM 10/08/2023    2:56 PM 09/25/2022    3:39 PM 06/13/2021    8:45 AM  PHQ 2/9 Scores  PHQ - 2 Score 0 0 0 0  PHQ- 9 Score   0      Fall Risk    10/08/2023    2:57 PM  Fall Risk   Falls in the past year? 0  Number falls in past yr: 0  Injury with Fall? 0  Risk for fall due to : No Fall Risks  Follow up Falls evaluation completed    Cognitive Function        10/08/2023    2:57 PM  6CIT Screen  What Year? 0 points  What month? 0 points  What time? 0 points  Count back from 20  0 points  Months in reverse 0 points  Repeat phrase 2 points  Total Score 2 points    Patient Care Team: Jarold Medici, MD as PCP - General (Internal Medicine)     Plan:   . Problem List Items Addressed This Visit       Endocrine   Hypogonadism male   Estradiol  levels are elevated, possibly due to exposure to wife's topical estrogen cream or other bioidentical hormones. Discussed potential environmental contamination and the need for  separate hand towels. Consideration of dietary changes such as increasing intake of cruciferous vegetables and dandelion root tea to aid in detoxification. - Discuss with wife about her use of estrogen cream or other bioidentical hormones. - Implement separate hand towels for husband and wife. - Consider increasing intake of cruciferous vegetables and dandelion root tea.      Primary hypothyroidism   Chronic, currently taking generic levothyroxine  25mcg daily. I reviewed his labs during his visit. He will continue with his current meds. May need to adjust to 37.5mg  (1-1/2 tabs) daily to reach TSH around 1.0.    Lab Results  Component Value Date   TSH 3.950 10/02/2023           Other   Hyperlipemia   Currently managed with rosuvastatin  40 mg every other day. LDL is 55 and ApoB is 77, indicating good control. Lipoprotein A is 18, lower than expected given family history of heart disease. Discussed potential influence of supplements on lipid levels. He expressed concern about potential rhabdomyolysis due to statin use and weightlifting, but no symptoms reported.       Prediabetes   Previous labs reviewed, her A1c has been elevated in the past. I will check an A1c today. Reminded to avoid refined sugars including sugary drinks/foods and processed meats including bacon, sausages and deli meats.   Lab Results  Component Value Date   HGBA1C 6.0 (H) 10/02/2023         Encounter for Medicare annual wellness exam - Primary   The annual wellness visit was performed including discussion of advanced directives, assessment of functional status and cognitive function. A full exam was also performed. He will rto in one year for AWV with [facility] Advisor.  DRE deferred. He is advised to get 30-45 minutes of regular exercise, no less than four to five days per week. Both weight-bearing and aerobic exercises are recommended.  He is advised to follow a healthy diet with at least six fruits/veggies per day,  decrease intake of red meat and other saturated fats and to increase fish intake to twice weekly.  Meats/fish should not be fried -- baked, boiled or broiled is preferable. It is also important to cut back on your sugar intake.  Be sure to read labels - try to avoid anything with added sugar, high fructose corn syrup or other sweeteners.  If you must use a sweetener, you can try stevia or monkfruit.  It is also important to avoid artificially sweetened foods/beverages and diet drinks. Lastly, wear SPF 50 sunscreen on exposed skin and when in direct sunlight for an extended period of time.  Be sure to avoid fast food restaurants and aim for at least 60 ounces of water  daily.             I have personally reviewed and noted the following in the patient's chart:   Medical and social history Use of alcohol, tobacco or illicit drugs  Current medications and supplements including opioid prescriptions. Patient is  not currently taking opioid prescriptions. Functional ability and status Nutritional status Physical activity Advanced directives List of other physicians Hospitalizations, surgeries, and ER visits in previous 12 months Vitals Screenings to include cognitive, depression, and falls Referrals and appointments  In addition, I have reviewed and discussed with patient certain preventive protocols, quality metrics, and best practice recommendations. A written personalized care plan for preventive services as well as general preventive health recommendations were provided to patient.     Catheryn LOISE Slocumb, MD 10/15/2023

## 2023-10-08 NOTE — Patient Instructions (Addendum)
 Devon Fowler , Thank you for taking time to come for your Medicare Wellness Visit. I appreciate your ongoing commitment to your health goals. Please review the following plan we discussed and let me know if I can assist you in the future.   These are the goals we discussed:  Goals       Stay Healthy/ Get Stonger (pt-stated)      Lift weight twice a week with personal trainer.         This is a list of the screening recommended for you and due dates:  Health Maintenance  Topic Date Due   HIV Screening  Never done   Pneumococcal Vaccine for age over 34 (1 of 1 - PCV) Never done   COVID-19 Vaccine (7 - Moderna risk 2024-25 season) 04/30/2023   Flu Shot  09/11/2023   Medicare Annual Wellness Visit  10/07/2024   DTaP/Tdap/Td vaccine (2 - Td or Tdap) 06/12/2030   Colon Cancer Screening  06/28/2030   Hepatitis C Screening  Completed   Zoster (Shingles) Vaccine  Completed   Hepatitis B Vaccine  Aged Out   HPV Vaccine  Aged Out   Meningitis B Vaccine  Aged Out     Health Maintenance, Male Adopting a healthy lifestyle and getting preventive care are important in promoting health and wellness. Ask your health care provider about: The right schedule for you to have regular tests and exams. Things you can do on your own to prevent diseases and keep yourself healthy. What should I know about diet, weight, and exercise? Eat a healthy diet  Eat a diet that includes plenty of vegetables, fruits, low-fat dairy products, and lean protein. Do not eat a lot of foods that are high in solid fats, added sugars, or sodium. Maintain a healthy weight Body mass index (BMI) is a measurement that can be used to identify possible weight problems. It estimates body fat based on height and weight. Your health care provider can help determine your BMI and help you achieve or maintain a healthy weight. Get regular exercise Get regular exercise. This is one of the most important things you can do for your  health. Most adults should: Exercise for at least 150 minutes each week. The exercise should increase your heart rate and make you sweat (moderate-intensity exercise). Do strengthening exercises at least twice a week. This is in addition to the moderate-intensity exercise. Spend less time sitting. Even light physical activity can be beneficial. Watch cholesterol and blood lipids Have your blood tested for lipids and cholesterol at 65 years of age, then have this test every 5 years. You may need to have your cholesterol levels checked more often if: Your lipid or cholesterol levels are high. You are older than 65 years of age. You are at high risk for heart disease. What should I know about cancer screening? Many types of cancers can be detected early and may often be prevented. Depending on your health history and family history, you may need to have cancer screening at various ages. This may include screening for: Colorectal cancer. Prostate cancer. Skin cancer. Lung cancer. What should I know about heart disease, diabetes, and high blood pressure? Blood pressure and heart disease High blood pressure causes heart disease and increases the risk of stroke. This is more likely to develop in people who have high blood pressure readings or are overweight. Talk with your health care provider about your target blood pressure readings. Have your blood pressure checked: Every 3-5  years if you are 33-40 years of age. Every year if you are 79 years old or older. If you are between the ages of 64 and 61 and are a current or former smoker, ask your health care provider if you should have a one-time screening for abdominal aortic aneurysm (AAA). Diabetes Have regular diabetes screenings. This checks your fasting blood sugar level. Have the screening done: Once every three years after age 83 if you are at a normal weight and have a low risk for diabetes. More often and at a younger age if you are  overweight or have a high risk for diabetes. What should I know about preventing infection? Hepatitis B If you have a higher risk for hepatitis B, you should be screened for this virus. Talk with your health care provider to find out if you are at risk for hepatitis B infection. Hepatitis C Blood testing is recommended for: Everyone born from [birth year] through 1967. Anyone with known risk factors for hepatitis C. Sexually transmitted infections (STIs) You should be screened each year for STIs, including gonorrhea and chlamydia, if: You are sexually active and are younger than 65 years of age. You are older than 65 years of age and your health care provider tells you that you are at risk for this type of infection. Your sexual activity has changed since you were last screened, and you are at increased risk for chlamydia or gonorrhea. Ask your health care provider if you are at risk. Ask your health care provider about whether you are at high risk for HIV. Your health care provider may recommend a prescription medicine to help prevent HIV infection. If you choose to take medicine to prevent HIV, you should first get tested for HIV. You should then be tested every 3 months for as long as you are taking the medicine. Follow these instructions at home: Alcohol use Do not drink alcohol if your health care provider tells you not to drink. If you drink alcohol: Limit how much you have to 0-2 drinks a day. Know how much alcohol is in your drink. In the [place] one drink equals one 12 oz bottle of beer (355 mL), one 5 oz glass of wine (148 mL), or one 1 oz glass of hard liquor (44 mL). Lifestyle Do not use any products that contain nicotine or tobacco. These products include cigarettes, chewing tobacco, and vaping devices, such as e-cigarettes. If you need help quitting, ask your health care provider. Do not use street drugs. Do not share needles. Ask your health care provider for help if you need support or  information about quitting drugs. General instructions Schedule regular health, dental, and eye exams. Stay current with your vaccines. Tell your health care provider if: You often feel depressed. You have ever been abused or do not feel safe at home. Summary Adopting a healthy lifestyle and getting preventive care are important in promoting health and wellness. Follow your health care provider's instructions about healthy diet, exercising, and getting tested or screened for diseases. Follow your health care provider's instructions on monitoring your cholesterol and blood pressure. This information is not intended to replace advice given to you by your health care provider. Make sure you discuss any questions you have with your health care provider. Document Revised: 06/18/2020 Document Reviewed: 06/18/2020 Elsevier Patient Education  2024 [employer].

## 2023-10-13 ENCOUNTER — Ambulatory Visit: Payer: Self-pay | Admitting: Internal Medicine

## 2023-10-14 DIAGNOSIS — Z0000 Encounter for general adult medical examination without abnormal findings: Secondary | ICD-10-CM | POA: Insufficient documentation

## 2023-10-14 NOTE — Assessment & Plan Note (Signed)
 Previous labs reviewed, her A1c has been elevated in the past. I will check an A1c today. Reminded to avoid refined sugars including sugary drinks/foods and processed meats including bacon, sausages and deli meats.   Lab Results  Component Value Date   HGBA1C 6.0 (H) 10/02/2023

## 2023-10-14 NOTE — Assessment & Plan Note (Signed)
 Estradiol  levels are elevated, possibly due to exposure to wife's topical estrogen cream or other bioidentical hormones. Discussed potential environmental contamination and the need for separate hand towels. Consideration of dietary changes such as increasing intake of cruciferous vegetables and dandelion root tea to aid in detoxification. - Discuss with wife about her use of estrogen cream or other bioidentical hormones. - Implement separate hand towels for husband and wife. - Consider increasing intake of cruciferous vegetables and dandelion root tea.

## 2023-10-14 NOTE — Assessment & Plan Note (Signed)
 Chronic, currently taking generic levothyroxine  25mcg daily. I reviewed his labs during his visit. He will continue with his current meds. May need to adjust to 37.5mg  (1-1/2 tabs) daily to reach TSH around 1.0.    Lab Results  Component Value Date   TSH 3.950 10/02/2023

## 2023-10-14 NOTE — Assessment & Plan Note (Signed)
 Currently managed with rosuvastatin  40 mg every other day. LDL is 55 and ApoB is 77, indicating good control. Lipoprotein A is 18, lower than expected given family history of heart disease. Discussed potential influence of supplements on lipid levels. He expressed concern about potential rhabdomyolysis due to statin use and weightlifting, but no symptoms reported.

## 2023-10-14 NOTE — Assessment & Plan Note (Signed)
 The annual wellness visit was performed including discussion of advanced directives, assessment of functional status and cognitive function. EKG performed, . A full exam was also performed. He will rto in one year for AWV with [facility] Advisor.  DRE deferred. He is advised to get 30-45 minutes of regular exercise, no less than four to five days per week. Both weight-bearing and aerobic exercises are recommended.  He is advised to follow a healthy diet with at least six fruits/veggies per day, decrease intake of red meat and other saturated fats and to increase fish intake to twice weekly.  Meats/fish should not be fried -- baked, boiled or broiled is preferable. It is also important to cut back on your sugar intake.  Be sure to read labels - try to avoid anything with added sugar, high fructose corn syrup or other sweeteners.  If you must use a sweetener, you can try stevia or monkfruit.  It is also important to avoid artificially sweetened foods/beverages and diet drinks. Lastly, wear SPF 50 sunscreen on exposed skin and when in direct sunlight for an extended period of time.  Be sure to avoid fast food restaurants and aim for at least 60 ounces of water  daily.

## 2023-10-30 ENCOUNTER — Other Ambulatory Visit: Payer: Self-pay | Admitting: Internal Medicine

## 2023-10-30 ENCOUNTER — Ambulatory Visit: Payer: Self-pay

## 2023-10-30 DIAGNOSIS — Z23 Encounter for immunization: Secondary | ICD-10-CM

## 2023-10-30 MED ORDER — PNEUMOCOCCAL 20-VAL CONJ VACC 0.5 ML IM SUSY: 0.5000 mL | PREFILLED_SYRINGE | INTRAMUSCULAR | 0 refills | Status: AC

## 2023-11-02 ENCOUNTER — Other Ambulatory Visit: Payer: Self-pay | Admitting: Internal Medicine

## 2023-11-02 NOTE — Telephone Encounter (Unsigned)
 Copied from CRM [id number]. Topic: Clinical - Medication Refill >> Nov 02, 2023  3:36 PM Donna E wrote: Medication: Testosterone  5.5 MG/ACT GEL  Has the patient contacted their pharmacy? Yes Pharmacy called in  This is the patient's preferred pharmacy:   [facility] [address] [place], [place] [address] Phone [phone]   Is this the correct pharmacy for this prescription? Yes If no, delete pharmacy and type the correct one.   Has the prescription been filled recently? Yes  Is the patient out of the medication? Yes  Has the patient been seen for an appointment in the last year OR does the patient have an upcoming appointment? Yes  Can we respond through MyChart? No  Agent: Please be advised that Rx refills may take up to 3 business days. We ask that you follow-up with your pharmacy.

## 2023-11-07 ENCOUNTER — Encounter: Payer: Self-pay | Admitting: Internal Medicine

## 2023-11-24 ENCOUNTER — Other Ambulatory Visit: Payer: Self-pay | Admitting: Internal Medicine

## 2023-11-24 DIAGNOSIS — R7303 Prediabetes: Secondary | ICD-10-CM

## 2023-12-22 ENCOUNTER — Other Ambulatory Visit: Payer: Self-pay | Admitting: Internal Medicine

## 2023-12-22 DIAGNOSIS — E7800 Pure hypercholesterolemia, unspecified: Secondary | ICD-10-CM

## 2024-01-24 ENCOUNTER — Other Ambulatory Visit: Payer: Self-pay | Admitting: Internal Medicine

## 2024-04-11 ENCOUNTER — Ambulatory Visit: Payer: Self-pay | Admitting: Internal Medicine

## 2024-11-09 ENCOUNTER — Ambulatory Visit: Payer: Self-pay
